# Patient Record
Sex: Male | Born: 1990 | Race: White | Hispanic: No | Smoking: Former smoker
Health system: Southern US, Community
[De-identification: ages and names within clinical notes are randomized; demographics above are authoritative.]

## PROBLEM LIST (undated history)

## (undated) DIAGNOSIS — K760 Fatty (change of) liver, not elsewhere classified: Secondary | ICD-10-CM

## (undated) DIAGNOSIS — R0789 Other chest pain: Secondary | ICD-10-CM

## (undated) DIAGNOSIS — K219 Gastro-esophageal reflux disease without esophagitis: Secondary | ICD-10-CM

## (undated) DIAGNOSIS — E782 Mixed hyperlipidemia: Secondary | ICD-10-CM

## (undated) DIAGNOSIS — E559 Vitamin D deficiency, unspecified: Secondary | ICD-10-CM

## (undated) DIAGNOSIS — R7401 Elevation of levels of liver transaminase levels: Secondary | ICD-10-CM

## (undated) HISTORY — DX: Elevation of levels of liver transaminase levels: R74.01

## (undated) HISTORY — DX: Vitamin D deficiency, unspecified: E55.9

## (undated) HISTORY — PX: WISDOM TOOTH EXTRACTION: SHX21

## (undated) HISTORY — PX: SHOULDER SURGERY: SHX246

## (undated) HISTORY — PX: EYE SURGERY: SHX253

## (undated) HISTORY — DX: Fatty (change of) liver, not elsewhere classified: K76.0

## (undated) HISTORY — DX: Other chest pain: R07.89

## (undated) HISTORY — DX: Mixed hyperlipidemia: E78.2

## (undated) HISTORY — DX: Gastro-esophageal reflux disease without esophagitis: K21.9

---
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
## 2012-12-22 ENCOUNTER — Ambulatory Visit (INDEPENDENT_AMBULATORY_CARE_PROVIDER_SITE_OTHER): Payer: BC Managed Care – PPO | Admitting: Sports Medicine

## 2012-12-22 ENCOUNTER — Encounter: Payer: Self-pay | Admitting: Sports Medicine

## 2012-12-22 VITALS — BP 107/58 | HR 43 | Wt 189.0 lb

## 2012-12-22 DIAGNOSIS — IMO0002 Reserved for concepts with insufficient information to code with codable children: Secondary | ICD-10-CM

## 2012-12-22 DIAGNOSIS — M5412 Radiculopathy, cervical region: Secondary | ICD-10-CM | POA: Insufficient documentation

## 2012-12-22 DIAGNOSIS — M5416 Radiculopathy, lumbar region: Secondary | ICD-10-CM | POA: Insufficient documentation

## 2012-12-22 MED ORDER — CYCLOBENZAPRINE HCL 10 MG PO TABS: ORAL_TABLET | ORAL | Status: DC

## 2012-12-22 MED ORDER — KETOROLAC TROMETHAMINE 30 MG/ML IJ SOLN
30.0000 mg | Freq: Once | INTRAMUSCULAR | Status: AC
  Administered 2012-12-22: 30 mg via INTRAMUSCULAR

## 2012-12-22 MED ORDER — PREDNISONE 50 MG PO TABS: ORAL_TABLET | ORAL | Status: DC

## 2012-12-22 MED ORDER — MELOXICAM 15 MG PO TABS: ORAL_TABLET | ORAL | Status: DC
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
## 2012-12-22 NOTE — Assessment & Plan Note (Signed)
Likely left L5. Prednisone, Mobic, Flexeril, formal physical therapy. Return in one month, MRI for interventional injection planning if no better.

## 2012-12-22 NOTE — Assessment & Plan Note (Signed)
Likely left C7. Prednisone, Mobic, Flexeril, formal physical therapy. Return in one month, MRI for interventional injection planning if no better.

## 2012-12-28 ENCOUNTER — Ambulatory Visit (INDEPENDENT_AMBULATORY_CARE_PROVIDER_SITE_OTHER): Payer: BC Managed Care – PPO | Admitting: Physical Therapy

## 2012-12-28 DIAGNOSIS — M2550 Pain in unspecified joint: Secondary | ICD-10-CM

## 2012-12-28 DIAGNOSIS — IMO0002 Reserved for concepts with insufficient information to code with codable children: Secondary | ICD-10-CM

## 2013-01-02 ENCOUNTER — Encounter (INDEPENDENT_AMBULATORY_CARE_PROVIDER_SITE_OTHER): Payer: BC Managed Care – PPO | Admitting: Physical Therapy

## 2013-01-02 DIAGNOSIS — IMO0002 Reserved for concepts with insufficient information to code with codable children: Secondary | ICD-10-CM

## 2013-01-02 DIAGNOSIS — M2550 Pain in unspecified joint: Secondary | ICD-10-CM

## 2013-01-04 ENCOUNTER — Encounter (INDEPENDENT_AMBULATORY_CARE_PROVIDER_SITE_OTHER): Payer: BC Managed Care – PPO | Admitting: Physical Therapy

## 2013-01-04 DIAGNOSIS — IMO0002 Reserved for concepts with insufficient information to code with codable children: Secondary | ICD-10-CM

## 2013-01-04 DIAGNOSIS — M2550 Pain in unspecified joint: Secondary | ICD-10-CM

## 2013-01-08 ENCOUNTER — Encounter (INDEPENDENT_AMBULATORY_CARE_PROVIDER_SITE_OTHER): Payer: BC Managed Care – PPO | Admitting: Physical Therapy

## 2013-01-08 DIAGNOSIS — IMO0002 Reserved for concepts with insufficient information to code with codable children: Secondary | ICD-10-CM

## 2013-01-08 DIAGNOSIS — M2550 Pain in unspecified joint: Secondary | ICD-10-CM

## 2013-01-11 ENCOUNTER — Encounter: Payer: BC Managed Care – PPO | Admitting: Physical Therapy

## 2013-01-11 DIAGNOSIS — M2550 Pain in unspecified joint: Secondary | ICD-10-CM

## 2013-01-11 DIAGNOSIS — IMO0002 Reserved for concepts with insufficient information to code with codable children: Secondary | ICD-10-CM

## 2013-01-18 ENCOUNTER — Encounter (INDEPENDENT_AMBULATORY_CARE_PROVIDER_SITE_OTHER): Payer: BC Managed Care – PPO | Admitting: Physical Therapy

## 2013-01-18 DIAGNOSIS — M2550 Pain in unspecified joint: Secondary | ICD-10-CM

## 2013-01-18 DIAGNOSIS — IMO0002 Reserved for concepts with insufficient information to code with codable children: Secondary | ICD-10-CM

## 2013-01-22 ENCOUNTER — Encounter (INDEPENDENT_AMBULATORY_CARE_PROVIDER_SITE_OTHER): Payer: BC Managed Care – PPO | Admitting: Physical Therapy

## 2013-01-22 DIAGNOSIS — IMO0002 Reserved for concepts with insufficient information to code with codable children: Secondary | ICD-10-CM

## 2013-01-22 DIAGNOSIS — M2550 Pain in unspecified joint: Secondary | ICD-10-CM

## 2013-01-23 ENCOUNTER — Ambulatory Visit (INDEPENDENT_AMBULATORY_CARE_PROVIDER_SITE_OTHER): Payer: BC Managed Care – PPO | Admitting: Sports Medicine

## 2013-01-23 ENCOUNTER — Encounter: Payer: Self-pay | Admitting: Sports Medicine

## 2013-01-23 ENCOUNTER — Ambulatory Visit (INDEPENDENT_AMBULATORY_CARE_PROVIDER_SITE_OTHER): Payer: BC Managed Care – PPO

## 2013-01-23 VITALS — BP 132/80 | HR 70 | Wt 198.0 lb

## 2013-01-23 DIAGNOSIS — IMO0002 Reserved for concepts with insufficient information to code with codable children: Secondary | ICD-10-CM

## 2013-01-23 DIAGNOSIS — M5412 Radiculopathy, cervical region: Secondary | ICD-10-CM

## 2013-01-23 DIAGNOSIS — M5416 Radiculopathy, lumbar region: Secondary | ICD-10-CM

## 2013-01-23 DIAGNOSIS — M5126 Other intervertebral disc displacement, lumbar region: Secondary | ICD-10-CM

## 2013-01-23 MED ORDER — TRAMADOL HCL 50 MG PO TABS: 50.0000 mg | ORAL_TABLET | Freq: Three times a day (TID) | ORAL | Status: DC | PRN

## 2013-01-23 NOTE — Progress Notes (Signed)
  Subjective:    CC: Followup  HPI: Cervical radiculitis: Resolved with physical therapy and conservative measures.  Left lumbar radiculitis: Continues to have pain that radiates down the posterior aspect of the left leg and thigh. It's worse when sitting in a car for long periods of time, worse with Valsalva. He is improved significantly with physical therapy, as well as oral steroids, and NSAIDs, but unfortunately continues to have pain that is moderate, persistent. No bowel or bladder dysfunction, no constitutional symptoms.  Past medical history, Surgical history, Family history not pertinant except as noted below, Social history, Allergies, and medications have been entered into the medical record, reviewed, and no changes needed.   Review of Systems: No fevers, chills, night sweats, weight loss, chest pain, or shortness of breath.   Objective:    General: Well Developed, well nourished, and in no acute distress.  Neuro: Alert and oriented x3, extra-ocular muscles intact, sensation grossly intact.  HEENT: Normocephalic, atraumatic, pupils equal round reactive to light, neck supple, no masses, no lymphadenopathy, thyroid nonpalpable.  Skin: Warm and dry, no rashes. Cardiac: Regular rate and rhythm, no murmurs rubs or gallops, no lower extremity edema.  Respiratory: Clear to auscultation bilaterally. Not using accessory muscles, speaking in full sentences. Back Exam:  Inspection: Unremarkable  Motion: Flexion 45 deg, Extension 45 deg, Side Bending to 45 deg bilaterally,  Rotation to 45 deg bilaterally  SLR laying: Negative  XSLR laying: Negative  Palpable tenderness: None. FABER: negative. Sensory change: Gross sensation intact to all lumbar and sacral dermatomes.  Reflexes: 2+ at both patellar tendons, 2+ at achilles tendons, Babinski's downgoing.  Strength at foot  Plantar-flexion: 5/5 Dorsi-flexion: 5/5 Eversion: 5/5 Inversion: 5/5  Leg strength  Quad: 5/5 Hamstring: 5/5 Hip  flexor: 5/5 Hip abductors: 5/5  Gait unremarkable.  Impression and Recommendations:

## 2013-01-23 NOTE — Assessment & Plan Note (Signed)
Continues to have L5 versus S1 radiculitis on the left side. MRI for interventional injection planning. Tramadol, return to go over MRI results.

## 2013-01-23 NOTE — Assessment & Plan Note (Signed)
Resolved with physical therapy and oral steroids.

## 2013-02-05 ENCOUNTER — Ambulatory Visit (INDEPENDENT_AMBULATORY_CARE_PROVIDER_SITE_OTHER): Payer: BC Managed Care – PPO | Admitting: Sports Medicine

## 2013-02-05 ENCOUNTER — Encounter: Payer: Self-pay | Admitting: Sports Medicine

## 2013-02-05 VITALS — BP 106/58 | HR 49 | Wt 192.0 lb

## 2013-02-05 DIAGNOSIS — M5416 Radiculopathy, lumbar region: Secondary | ICD-10-CM

## 2013-02-05 DIAGNOSIS — IMO0002 Reserved for concepts with insufficient information to code with codable children: Secondary | ICD-10-CM
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
## 2013-02-05 NOTE — Assessment & Plan Note (Signed)
MRI confirms left-sided L5-S1 disc extrusion. This corresponds with symptoms, Scott Herring has failed conservative measures. At this point we are going to proceed with interventional injection at the L5-S1 level on the left side. I like to see him back after the injection.

## 2013-02-06 ENCOUNTER — Telehealth: Payer: Self-pay

## 2013-02-06 NOTE — Telephone Encounter (Signed)
Patient called to follow up with appt for injections. I called him back and left a message on his home vm advising him that Danyelle from [facility] would be contacting him to schedule that appt. Rhonda Cunningham,CMA

## 2013-02-07 ENCOUNTER — Telehealth: Payer: Self-pay

## 2013-02-07 NOTE — Telephone Encounter (Signed)
Patient called upset that he can not get an appt for epidural for this week, he wants to know if you can give him a Rx for something stronger, and he also stated that he will be out of town for 2 weeks.

## 2013-02-08 MED ORDER — HYDROCODONE-ACETAMINOPHEN 5-325 MG PO TABS: 1.0000 | ORAL_TABLET | Freq: Three times a day (TID) | ORAL | Status: DC | PRN

## 2013-02-08 NOTE — Telephone Encounter (Signed)
Left message on patient vm that Rx Hydrocodone was at office ready for pick up. Rhonda Cunningham,CMA

## 2013-02-08 NOTE — Telephone Encounter (Signed)
Why can't they do an epidural this week??  Also yes, I'll leave an RX for something in my box.

## 2013-02-09 ENCOUNTER — Ambulatory Visit
Admission: RE | Admit: 2013-02-09 | Discharge: 2013-02-09 | Disposition: A | Discharge status: Home / Self Care | Payer: BC Managed Care – PPO | Source: Ambulatory Visit | Attending: Sports Medicine | Admitting: Sports Medicine

## 2013-02-09 ENCOUNTER — Telehealth: Payer: Self-pay | Admitting: Sports Medicine

## 2013-02-09 VITALS — BP 121/52 | HR 48

## 2013-02-09 DIAGNOSIS — M5412 Radiculopathy, cervical region: Secondary | ICD-10-CM

## 2013-02-09 DIAGNOSIS — M5416 Radiculopathy, lumbar region: Secondary | ICD-10-CM

## 2013-02-09 MED ORDER — METHYLPREDNISOLONE ACETATE 40 MG/ML INJ SUSP (RADIOLOG
120.0000 mg | Freq: Once | INTRAMUSCULAR | Status: AC
  Administered 2013-02-09: 120 mg via EPIDURAL

## 2013-02-09 MED ORDER — IOHEXOL 180 MG/ML  SOLN
1.0000 mL | Freq: Once | INTRAMUSCULAR | Status: AC | PRN
  Administered 2013-02-09: 1 mL via EPIDURAL

## 2013-02-09 NOTE — Telephone Encounter (Signed)
Patient stopped by yesterday; physical therapist would like to know if we would like them to continue services to him.

## 2013-02-09 NOTE — Telephone Encounter (Signed)
Please see note below. Jametta Moorehead,CMA  

## 2013-02-09 NOTE — Telephone Encounter (Signed)
I would absolutely like him to continue if he felt like he was getting any benefit at all. How is he feeling after the epidural?

## 2013-02-12 NOTE — Telephone Encounter (Signed)
Left detailed message on patient vm to continue Physical therapy if he is benefiting from it and to see how he was doing after the epidural injection. Ezra Denne,CMA

## 2013-04-09 ENCOUNTER — Ambulatory Visit (INDEPENDENT_AMBULATORY_CARE_PROVIDER_SITE_OTHER): Payer: BC Managed Care – PPO | Admitting: Sports Medicine

## 2013-04-09 ENCOUNTER — Encounter: Payer: Self-pay | Admitting: Sports Medicine

## 2013-04-09 VITALS — BP 122/72 | HR 48 | Wt 194.0 lb

## 2013-04-09 DIAGNOSIS — R21 Rash and other nonspecific skin eruption: Secondary | ICD-10-CM | POA: Insufficient documentation

## 2013-04-09 DIAGNOSIS — IMO0002 Reserved for concepts with insufficient information to code with codable children: Secondary | ICD-10-CM

## 2013-04-09 DIAGNOSIS — M5416 Radiculopathy, lumbar region: Secondary | ICD-10-CM

## 2013-04-09 MED ORDER — GABAPENTIN 300 MG PO CAPS: ORAL_CAPSULE | ORAL | Status: DC

## 2013-04-09 MED ORDER — TRIAMCINOLONE ACETONIDE 0.5 % EX OINT: 1.0000 "application " | TOPICAL_OINTMENT | Freq: Two times a day (BID) | CUTANEOUS | Status: DC

## 2013-04-09 NOTE — Assessment & Plan Note (Signed)
Excellent response to L5-S1 interlaminar epidural steroid injection. I am going to add some gabapentin, and he will let us know when he is ready for another one if needed.

## 2013-04-09 NOTE — Progress Notes (Signed)
  Subjective:    CC: Follow up  HPI: Lumbar degenerative disc disease: With a left lumbar radiculitis, most recently had a lumbar epidural at the L5-S1 level which was tremendously effective. He felt fantastic for several days, only recently as he started to have some pain. He is not yet ready to have another epidural.  Skin rash: Left elbow, painful, burns a little bit, mild.  Past medical history, Surgical history, Family history not pertinant except as noted below, Social history, Allergies, and medications have been entered into the medical record, reviewed, and no changes needed.   Review of Systems: No fevers, chills, night sweats, weight loss, chest pain, or shortness of breath.   Objective:    General: Well Developed, well nourished, and in no acute distress.  Neuro: Alert and oriented x3, extra-ocular muscles intact, sensation grossly intact.  HEENT: Normocephalic, atraumatic, pupils equal round reactive to light, neck supple, no masses, no lymphadenopathy, thyroid nonpalpable.  Skin: Warm and dry, there is a rash on his left elbow, approximately 4 cm distal to the olecranon, is mildly excoriated and erythematous with some scaling. Cardiac: Regular rate and rhythm, no murmurs rubs or gallops, no lower extremity edema.  Respiratory: Clear to auscultation bilaterally. Not using accessory muscles, speaking in full sentences.  Impression and Recommendations:

## 2013-04-09 NOTE — Assessment & Plan Note (Signed)
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 I will call him in some triamcinolone cream, if no better in a couple of weeks we can certainly biopsy this, and proceed down the route of treatment for psoriasis.

## 2013-04-23 ENCOUNTER — Ambulatory Visit: Payer: BC Managed Care – PPO | Admitting: Sports Medicine

## 2013-05-31 HISTORY — PX: SHOULDER SURGERY: SHX246

## 2013-05-31 HISTORY — PX: ELBOW SURGERY: SHX618

## 2013-11-27 ENCOUNTER — Ambulatory Visit (INDEPENDENT_AMBULATORY_CARE_PROVIDER_SITE_OTHER): Payer: BC Managed Care – PPO | Admitting: Sports Medicine

## 2013-11-27 ENCOUNTER — Encounter: Payer: Self-pay | Admitting: Sports Medicine

## 2013-11-27 VITALS — BP 113/71 | HR 52 | Ht 71.0 in | Wt 183.0 lb

## 2013-11-27 DIAGNOSIS — H269 Unspecified cataract: Secondary | ICD-10-CM

## 2013-11-27 DIAGNOSIS — Z0000 Encounter for general adult medical examination without abnormal findings: Secondary | ICD-10-CM | POA: Insufficient documentation

## 2013-11-27 DIAGNOSIS — Z299 Encounter for prophylactic measures, unspecified: Secondary | ICD-10-CM

## 2013-11-27 DIAGNOSIS — M5416 Radiculopathy, lumbar region: Secondary | ICD-10-CM

## 2013-11-27 DIAGNOSIS — IMO0002 Reserved for concepts with insufficient information to code with codable children: Secondary | ICD-10-CM

## 2013-11-27 NOTE — Progress Notes (Signed)
  Subjective:    CC: Complete physical exam  HPI:  Scott Herring is a very pleasant 23 year old male, he needs a physical for work.  Left cataract: Needs a local optometrist and ophthalmologist. Sounds as though he has had a lens implant.  Left lumbar radiculitis: Continues to be resolved after left-sided selective S1 nerve root block, it was an issue with the insurance, and he needs it coded with a different ICD-9 code.  Past medical history, Surgical history, Family history not pertinant except as noted below, Social history, Allergies, and medications have been entered into the medical record, reviewed, and no changes needed.   Review of Systems: No headache, visual changes, nausea, vomiting, diarrhea, constipation, dizziness, abdominal pain, skin rash, fevers, chills, night sweats, swollen lymph nodes, weight loss, chest pain, body aches, joint swelling, muscle aches, shortness of breath, mood changes, visual or auditory hallucinations.  Objective:    General: Well Developed, well nourished, and in no acute distress.  Neuro: Alert and oriented x3, extra-ocular muscles intact, sensation grossly intact.  HEENT: Normocephalic, atraumatic, right pupil is equal round and reactive to light and accommodation, left pupil is fairly fixed, and has an abnormal configuration, neck supple, no masses, no lymphadenopathy, thyroid nonpalpable.  oropharynx, nasopharynx, actually her canals are unremarkable. Skin: Warm and dry, no rashes noted.  Cardiac: Regular rate and rhythm, no murmurs rubs or gallops.  Respiratory: Clear to auscultation bilaterally. Not using accessory muscles, speaking in full sentences.  Abdominal: Soft, nontender, nondistended, positive bowel sounds, no masses, no organomegaly.  Musculoskeletal: Shoulder, elbow, wrist, hip, knee, ankle stable, and with full range of motion.  Impression and Recommendations:    The patient was counselled, risk factors were discussed, anticipatory  guidance given.

## 2013-11-27 NOTE — Assessment & Plan Note (Signed)
Referral to optometry

## 2013-11-27 NOTE — Assessment & Plan Note (Signed)
Complete physical performed today. He will forward us his blood work.

## 2013-11-27 NOTE — Assessment & Plan Note (Signed)
Continues to be resolved after selective S1 nerve root block. Unfortunately it seems that there was an error with the coding. I would like triage to call his insurance and find out what code would make this covered.

## 2015-03-03 ENCOUNTER — Encounter: Payer: Self-pay | Admitting: Sports Medicine

## 2015-03-03 ENCOUNTER — Ambulatory Visit (INDEPENDENT_AMBULATORY_CARE_PROVIDER_SITE_OTHER): Payer: BLUE CROSS/BLUE SHIELD | Admitting: Sports Medicine

## 2015-03-03 VITALS — BP 117/54 | HR 55 | Ht 71.0 in | Wt 191.0 lb

## 2015-03-03 DIAGNOSIS — L700 Acne vulgaris: Secondary | ICD-10-CM | POA: Diagnosis not present

## 2015-03-03 DIAGNOSIS — Z0000 Encounter for general adult medical examination without abnormal findings: Secondary | ICD-10-CM | POA: Diagnosis not present

## 2015-03-03 MED ORDER — DOXYCYCLINE HYCLATE 100 MG PO TABS: 100.0000 mg | ORAL_TABLET | Freq: Every day | ORAL | Status: DC

## 2015-03-03 NOTE — Progress Notes (Signed)
  Subjective:    CC: Complete physical   HPI:  Preventive measures: Up-to-date on tetanus vaccination, flu vaccine is scheduled, blood work is done through his employer.  Acne: Amenable to trying oral antibiotics.  Past medical history, Surgical history, Family history not pertinant except as noted below, Social history, Allergies, and medications have been entered into the medical record, reviewed, and no changes needed.   Review of Systems: No headache, visual changes, nausea, vomiting, diarrhea, constipation, dizziness, abdominal pain, skin rash, fevers, chills, night sweats, swollen lymph nodes, weight loss, chest pain, body aches, joint swelling, muscle aches, shortness of breath, mood changes, visual or auditory hallucinations.  Objective:   General: Well Developed, well nourished, and in no acute distress.  Neuro: Alert and oriented x3, extra-ocular muscles intact, sensation grossly intact. Cranial nerves II through XII are intact, motor, sensory, and coordinative functions are all intact. HEENT: Normocephalic, atraumatic, right pupil is round and reactive to light, left pupil is post lens implant, neck supple, no masses, no lymphadenopathy, thyroid nonpalpable. Oropharynx, nasopharynx, external ear canals are unremarkable. Skin: Warm and dry, no rashes noted. Multiple comedones on the back, inflammatory. Cardiac: Regular rate and rhythm, no murmurs rubs or gallops.  Respiratory: Clear to auscultation bilaterally. Not using accessory muscles, speaking in full sentences.  Abdominal: Soft, nontender, nondistended, positive bowel sounds, no masses, no organomegaly.  Musculoskeletal: Shoulder, elbow, wrist, hip, knee, ankle stable, and with full range of motion.  Impression and Recommendations:    The patient was counselled, risk factors were discussed, anticipatory guidance given.

## 2015-03-03 NOTE — Assessment & Plan Note (Signed)
Completed his glasses above, blood work is done through his employer, results will be forwarded to [place].

## 2015-03-03 NOTE — Assessment & Plan Note (Signed)
Adding doxycycline 100 mg daily, we will anticipate 1-3 months of treatment.

## 2015-03-31 ENCOUNTER — Ambulatory Visit (INDEPENDENT_AMBULATORY_CARE_PROVIDER_SITE_OTHER): Payer: BLUE CROSS/BLUE SHIELD | Admitting: Sports Medicine

## 2015-03-31 ENCOUNTER — Encounter: Payer: Self-pay | Admitting: Sports Medicine

## 2015-03-31 VITALS — BP 130/76 | HR 67 | Ht 71.0 in | Wt 196.0 lb

## 2015-03-31 DIAGNOSIS — L700 Acne vulgaris: Secondary | ICD-10-CM | POA: Diagnosis not present

## 2015-03-31 MED ORDER — DOXYCYCLINE HYCLATE 100 MG PO TABS: 100.0000 mg | ORAL_TABLET | Freq: Every day | ORAL | Status: DC

## 2015-03-31 NOTE — Assessment & Plan Note (Signed)
Continue doxycycline, fantastic improvement in extensive acne on his back after a single month. Return to see me in 6 months.

## 2015-03-31 NOTE — Progress Notes (Signed)
  Subjective:    CC: follow-up  HPI: Acne: Fantastic response to oral doxycycline. Acne was predominantly on the back and is nearly completely clear. This is after one month of oral doxycycline treatment.  Past medical history, Surgical history, Family history not pertinant except as noted below, Social history, Allergies, and medications have been entered into the medical record, reviewed, and no changes needed.   Review of Systems: No fevers, chills, night sweats, weight loss, chest pain, or shortness of breath.   Objective:    General: Well Developed, well nourished, and in no acute distress.  Neuro: Alert and oriented x3, extra-ocular muscles intact, sensation grossly intact.  HEENT: Normocephalic, atraumatic, pupils equal round reactive to light, neck supple, no masses, no lymphadenopathy, thyroid nonpalpable.  Skin: Warm and dry, no rashes.  there are only 2 pustules on his back. There is significant scarring from old acne. Cardiac: Regular rate and rhythm, no murmurs rubs or gallops, no lower extremity edema.  Respiratory: Clear to auscultation bilaterally. Not using accessory muscles, speaking in full sentences.  Impression and Recommendations:

## 2015-06-01 HISTORY — PX: TONSILLECTOMY: SUR1361

## 2015-09-01 ENCOUNTER — Ambulatory Visit (INDEPENDENT_AMBULATORY_CARE_PROVIDER_SITE_OTHER): Payer: BLUE CROSS/BLUE SHIELD | Admitting: Family Medicine

## 2015-09-01 ENCOUNTER — Encounter: Payer: Self-pay | Admitting: Family Medicine

## 2015-09-01 ENCOUNTER — Ambulatory Visit (INDEPENDENT_AMBULATORY_CARE_PROVIDER_SITE_OTHER): Payer: BLUE CROSS/BLUE SHIELD

## 2015-09-01 VITALS — BP 134/78 | HR 69 | Wt 200.0 lb

## 2015-09-01 DIAGNOSIS — S43005A Unspecified dislocation of left shoulder joint, initial encounter: Secondary | ICD-10-CM | POA: Diagnosis not present

## 2015-09-01 DIAGNOSIS — S5400XA Injury of ulnar nerve at forearm level, unspecified arm, initial encounter: Secondary | ICD-10-CM | POA: Insufficient documentation

## 2015-09-01 DIAGNOSIS — S5402XA Injury of ulnar nerve at forearm level, left arm, initial encounter: Secondary | ICD-10-CM | POA: Diagnosis not present

## 2015-09-01 DIAGNOSIS — X58XXXA Exposure to other specified factors, initial encounter: Secondary | ICD-10-CM | POA: Diagnosis not present

## 2015-09-01 MED ORDER — GABAPENTIN 300 MG PO CAPS: ORAL_CAPSULE | ORAL | Status: DC
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
## 2015-09-01 NOTE — Patient Instructions (Addendum)
Thank you for coming in today. Return next week.  Sleep in the sling.  I am worried about an ulnar nerve injury.  Take percocet for pain as needed.   Use gabapentin as needed  Get CT scan today  Or tomorrow.     Ulnar Nerve Contusion With Rehab The ulnar nerve lies near the surface of the skin as it passes by the elbow. This location causes it to be susceptible to injury. An ulnar nerve contusion is a bruise of the nerve. It is the result of direct trauma to the elbow. Ulnar nerve contusions are characterized by pain, weakness, and loss of feeling in the hand. SYMPTOMS   Signs of nerve damage include: tingling, numbness, weakness, and/or loss of feeling in the hand, specifically the little finger and ring finger.  Sharp pains that may shoot from the elbow to the wrist and hand.  Decreased hand function.  Tenderness and/ or inflammation in the elbow.  Muscle wasting (atrophy) in the hand. CAUSES  Ulnar nerve contusions are caused by direct trauma to the elbow that results in bleeding which enters the nerve. RISK INCREASES WITH:  Contact sports (football, soccer, or rugby).  Bleeding disorders.  Taking blood thinning medicine (warfarin [Coumadin], aspirin, or nonsteroidal anti-inflammatory medications).  Diabetes mellitus.  Underactive thyroid gland (hypothyroidism). PREVENTION  Wear properly fitted and padded protective equipment.  Only take blood thinning medication when necessary. PROGNOSIS  Ulnar nerve contusions usually heal within 6 weeks. Healing often occurs spontaneously, but treatment helps reduce symptoms.  RELATED COMPLICATIONS   Permanent nerve damage, including pain, numbness, tingling, or weakness in the hand (rare).  Weak grip.  Prolonged healing time, if improperly treated or re-injured. TREATMENT  Treatment initially involves resting from any activities that aggravate the symptoms, and the use of ice and medications to help reduce pain and  inflammation. The use of strengthening and stretching exercises may help reduce pain with activity. These exercises may be performed at home or with referral to a therapist. Your caregiver may recommend that you splint the elbow at night to help healing of the nerve. If symptoms persist despite conservative (non-surgical) treatment, then surgery may be recommended to free the nerve. MEDICATION   If pain medication is necessary, then nonsteroidal anti-inflammatory medications, such as aspirin and ibuprofen, or other minor pain relievers, such as acetaminophen, are often recommended.  Do not take pain medication within 7 days before surgery.  Prescription pain relievers may be given if deemed necessary by your caregiver. Use only as directed and only as much as you need. COLD THERAPY  Cold treatment (icing) relieves pain and reduces inflammation. Cold treatment should be applied for 10 to 15 minutes every 2 to 3 hours for inflammation and pain and immediately after any activity that aggravates your symptoms. Use ice packs or massage the area with a piece of ice (ice massage). SEEK MEDICAL CARE IF:   Treatment seems to offer no benefit, or the condition worsens.  Any medications produce adverse side effects. EXERCISES RANGE OF MOTION (ROM) AND STRETCHING EXERCISES - Ulnar Nerve Contusion These exercises may help you when beginning to rehabilitate your injury. Do not begin these exercises until your physician, physical therapist or athletic trainer advises you to do so. Discontinue any exercise that worsens your symptoms. Contact your physician with instructions on how to continue. Your symptoms may resolve with or without further involvement from your physician, physical therapist or athletic trainer. While completing these exercises, remember:  Restoring tissue flexibility helps  normal motion to return to the joints. This allows healthier, less painful movement and activity.  An effective stretch  should be held for at least 30 seconds.  A stretch should never be painful. You should only feel a gentle lengthening or release in the stretched tissue. RANGE OF MOTION - Extension  Hold your right / left arm at your side and straighten your elbow as far as you can using your right / left arm muscles.  Straighten the right / left elbow farther by gently pushing down on your forearm until you feel a gentle stretch on the inside of your elbow. Hold this position for __________ seconds.  Slowly return to the starting position. Repeat __________ times. Complete this exercise __________ times per day.  RANGE OF MOTION - Flexion  Hold your right / left arm at your side and bend your elbow as far as you can using your right / left arm muscles.  Bend the right / left elbow farther by gently pushing up on your forearm until you feel a gentle stretch on the outside of your elbow. Hold this position for __________ seconds.  Slowly return to the starting position. Repeat __________ times. Complete this exercise __________ times per day.  RANGE OF MOTION - Wrist Flexion, Active-Assisted  Extend your right / left elbow with your fingers pointing down.*  Gently pull the back of your hand towards you until you feel a gentle stretch on the top of your forearm.  Hold this position for __________ seconds. Repeat __________ times. Complete this exercise __________ times per day.  *If directed by your physician, physical therapist or athletic trainer, complete this stretch with your elbow bent rather than extended. RANGE OF MOTION - Wrist Extension, Active-Assisted  Extend your right / left elbow and turn your palm upwards.*  Gently pull your palm/fingertips back so your wrist extends and your fingers point more toward the ground.  You should feel a gentle stretch on the inside of your forearm.  Hold this position for __________ seconds. Repeat __________ times. Complete this exercise __________  times per day. *If directed by your physician, physical therapist or athletic trainer, complete this stretch with your elbow bent, rather than extended. RANGE OF MOTION - Supination, Active  Stand or sit with your elbows at your side. Bend your right / left elbow to 90 degrees.  Turn your palm upward until you feel a gentle stretch on the inside of your forearm.  Hold this position for __________ seconds. Slowly release and return to the starting position. Repeat __________ times. Complete this stretch __________ times per day.  RANGE OF MOTION - Pronation, Active  Stand or sit with your elbows at your side. Bend your right / left elbow to 90 degrees.  Turn your palm downward until you feel a gentle stretch on the top of your forearm.  Hold this position for __________ seconds. Slowly release and return to the starting position. Repeat __________ times. Complete this stretch __________ times per day.  STRETCH - Wrist Flexion   Place the back of your right / left hand on a tabletop leaving your elbow slightly bent. Your fingers should point away from your body.  Gently press the back of your hand down onto the table by straightening your elbow. You should feel a stretch on the top of your forearm.  Hold this position for __________ seconds. Repeat __________ times. Complete this stretch __________ times per day.  STRETCH - Wrist Extension   Place your right /  left fingertips on a tabletop leaving your elbow slightly bent. Your fingers should point backwards.  Gently press your fingers and palm down onto the table by straightening your elbow. You should feel a stretch on the inside of your forearm.  Hold this position for __________ seconds. Repeat __________ times. Complete this stretch __________ times per day.  STRENGTHENING EXERCISES - Ulnar Nerve Contusion These exercises may help you when beginning to rehabilitate your injury. Do not begin these exercises until your physician,  physical therapist or athletic trainer advises you to do so. Discontinue any exercise that worsens your symptoms. Contact your physician for instructions on how to continue. They may resolve your symptoms with or without further involvement from your physician, physical therapist or athletic trainer. While completing these exercises, remember:   Muscles can gain both the endurance and the strength needed for everyday activities through controlled exercises.  Complete these exercises as instructed by your physician, physical therapist or athletic trainer. Progress with the resistance and repetition exercises only as your caregiver advises. STRENGTH - Wrist Flexors  Sit with your right / left forearm palm-up and fully supported. Your elbow should be resting below the height of your shoulder. Allow your wrist to extend over the edge of the surface.  Loosely holding a __________ weight or a piece of rubber exercise band/tubing, slowly curl your hand up toward your forearm.  Hold this position for __________ seconds. Slowly lower the wrist back to the starting position in a controlled manner. Repeat __________ times. Complete this exercise __________ times per day.  STRENGTH - Wrist Extensors  Sit with your right / left forearm palm-down and fully supported. Your elbow should be resting below the height of your shoulder. Allow your wrist to extend over the edge of the surface.  Loosely holding a __________ weight or a piece of rubber exercise band/tubing, slowly curl your hand up toward your forearm.  Hold this position for __________ seconds. Slowly lower the wrist back to the starting position in a controlled manner. Repeat __________ times. Complete this exercise __________ times per day.  STRENGTH - Ulnar Deviators  Stand with a ____________________ weight in your right / left hand or sit holding on to the rubber exercise band/tubing with your opposite arm supported.  Move your wrist so that  your pinkie travels toward your forearm and your thumb moves away from your forearm.  Hold this position for __________ seconds and then slowly lower the wrist back to the starting position. Repeat __________ times. Complete this exercise __________ times per day STRENGTH - Radial Deviators  Stand with a ____________________ weight in your right / left hand or sit holding on to the rubber exercise band/tubing with your arm supported.  Raise your hand upward in front of you or pull up on the rubber tubing.  Hold this position for __________ seconds and then slowly lower the wrist back to the starting position. Repeat __________ times. Complete this exercise __________ times per day. STRENGTH - Grip  Grasp a tennis ball, a dense sponge, or a large, rolled sock in your hand.  Squeeze as hard as you can without increasing any pain.  Hold this position for __________ seconds. Release your grip slowly. Repeat __________ times. Complete this exercise __________ times per day.    This information is not intended to replace advice given to you by your health care provider. Make sure you discuss any questions you have with your health care provider.   Document Released: 05/17/2005 Document Revised:  10/01/2014 Document Reviewed: 08/29/2008 Elsevier Interactive Patient Education [employer]

## 2015-09-02 ENCOUNTER — Telehealth: Payer: Self-pay | Admitting: Family Medicine

## 2015-09-02 DIAGNOSIS — S43005A Unspecified dislocation of left shoulder joint, initial encounter: Secondary | ICD-10-CM

## 2015-09-02 DIAGNOSIS — G5622 Lesion of ulnar nerve, left upper limb: Secondary | ICD-10-CM

## 2015-09-02 NOTE — Telephone Encounter (Signed)
I discussed the results of the CT scan with the patient. Will order MRI arthrogram and make sure pt with scheduled with Dr. Karie Schwalbe or myself on Monday for consultation and injection of contrast.

## 2015-09-02 NOTE — Progress Notes (Signed)
Quick Note:  CT shows a mild fracture. It should heal well. ______

## 2015-09-03 NOTE — Telephone Encounter (Signed)
Imaging has been authorized and scheduled.

## 2015-09-08 ENCOUNTER — Ambulatory Visit (INDEPENDENT_AMBULATORY_CARE_PROVIDER_SITE_OTHER): Payer: BLUE CROSS/BLUE SHIELD | Admitting: Family Medicine

## 2015-09-08 ENCOUNTER — Encounter: Payer: Self-pay | Admitting: Family Medicine

## 2015-09-08 ENCOUNTER — Ambulatory Visit: Payer: BLUE CROSS/BLUE SHIELD | Admitting: Sports Medicine

## 2015-09-08 ENCOUNTER — Ambulatory Visit (INDEPENDENT_AMBULATORY_CARE_PROVIDER_SITE_OTHER): Payer: BLUE CROSS/BLUE SHIELD

## 2015-09-08 VITALS — BP 112/50 | HR 64 | Wt 199.0 lb

## 2015-09-08 DIAGNOSIS — X58XXXA Exposure to other specified factors, initial encounter: Secondary | ICD-10-CM | POA: Diagnosis not present

## 2015-09-08 DIAGNOSIS — S43005A Unspecified dislocation of left shoulder joint, initial encounter: Secondary | ICD-10-CM

## 2015-09-08 DIAGNOSIS — S5402XA Injury of ulnar nerve at forearm level, left arm, initial encounter: Secondary | ICD-10-CM | POA: Diagnosis not present

## 2015-09-08 MED ORDER — AMITRIPTYLINE HCL 25 MG PO TABS: 25.0000 mg | ORAL_TABLET | Freq: Every day | ORAL | Status: DC

## 2015-09-08 MED ORDER — OXYCODONE-ACETAMINOPHEN 5-325 MG PO TABS: 1.0000 | ORAL_TABLET | Freq: Three times a day (TID) | ORAL | Status: DC | PRN

## 2015-09-08 NOTE — Assessment & Plan Note (Signed)
Continued ulnar nerve injury symptoms. MRI pending. Continue gabapentin. Add amitriptyline.

## 2015-09-08 NOTE — Progress Notes (Signed)
Scott Herring is a 25 y.o. male who presents to [facility] today for follow-up left shoulder dislocation and follow-up paresthesias.  Patient was seen last week for a shoulder dislocation follow-up. He's here today for follow-up certification and also for interarticular contrast. 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 He notes continued numbness and tingling especially to the ulnar side of his hand and the fifth and on her half of the fourth digit. He notes continued weakness especially with hand adduction. He notes that gabapentin is mildly helpful. He's run out of the Percocet that were prescribed during his initial hospital visit.   No past medical history on file. No past surgical history on file. Social History  Substance Use Topics  . Smoking status: Smoker, Current Status Unknown  . Smokeless tobacco: Current User    Types: Chew  . Alcohol Use: Yes   family history is not on file.  ROS:  No headache, visual changes, nausea, vomiting, diarrhea, constipation, dizziness, abdominal pain, skin rash, fevers, chills, night sweats, weight loss, swollen lymph nodes, body aches, joint swelling, muscle aches, chest pain, shortness of breath, mood changes, visual or auditory hallucinations.    Medications: Current Outpatient Prescriptions  Medication Sig Dispense Refill  . gabapentin (NEURONTIN) 300 MG capsule One tab PO qHS for a week, then BID for a week, then TID. May double weekly to a max of 3,600mg /day 180 capsule 3  . amitriptyline (ELAVIL) 25 MG tablet Take 1 tablet (25 mg total) by mouth at bedtime. 90 tablet 0  . oxyCODONE-acetaminophen (PERCOCET/ROXICET) 5-325 MG tablet Take 1 tablet by mouth every 8 (eight) hours as needed. 30 tablet 0   No current facility-administered medications for this visit.   No Known Allergies   Exam:  BP 112/50 mmHg  Pulse 64  Wt 199 lb (90.266 kg) General: Well  Developed, well nourished, and in no acute distress.  Neuro/Psych: Alert and oriented x3, extra-ocular muscles intact, able to move all 4 extremities, sensation grossly intact. Skin: Warm and dry, no rashes noted.  Respiratory: Not using accessory muscles, speaking in full sentences, trachea midline.  Cardiovascular: Pulses palpable, no extremity edema. Abdomen: Does not appear distended. MSK: Left shoulder normal-appearing. Tender. Motion not tested. No deformity. Hand continued subjective numbness and tingling to the ulnar hand. He continues to have a mild hand of Benediction  and has weakness with hand adduction          Patient presents to clinic for  previously scheduled gadolinium interarticular contrast.  Procedure: Real-time Ultrasound Guided Injection of left shoulder  Device: GE Logiq E  Images permanently stored and available for review in the ultrasound unit. Verbal informed consent obtained. Discussed risks and benefits of procedure. Warned about infection bleeding damage to structures skin hypopigmentation and fat atrophy among others. Patient expresses understanding and agreement Time-out conducted.  Noted no overlying erythema, induration, or other signs of local infection.  Skin prepped in a sterile fashion.  Local anesthesia: Topical Ethyl chloride.  With sterile technique and under real time ultrasound guidance: 5 mL of lidocaine, gadolinium contrast and 9 mL of sterile saline injected easily.  Completed without difficulty    Advised to call if fevers/chills, erythema, induration, drainage, or persistent bleeding.  Images permanently stored and available for review in the ultrasound unit.  Impression: Technically successful ultrasound guided injection.  No results found for this or any previous visit (from the past 24 hour(s)). No results found.  Please see individual assessment and plan sections.

## 2015-09-08 NOTE — Assessment & Plan Note (Signed)
With Hill-Sachs injury and probable labrum tear. MRI arthrogram pending. Will call patient with results.

## 2015-09-08 NOTE — Patient Instructions (Signed)
Thank you for coming in today. Get MRI now.  We will call with results.

## 2015-09-10 ENCOUNTER — Telehealth: Payer: Self-pay | Admitting: Family Medicine

## 2015-09-10 NOTE — Telephone Encounter (Signed)
Quick Note:  MRI shows tearing of the labrum like we were worried about. I have [facility] you [facility] named [place] Ave Filterhandler. He is very good at this injury. ______

## 2015-09-10 NOTE — Telephone Encounter (Signed)
Referral to Dr Ave Filterhandler placed

## 2015-09-10 NOTE — Telephone Encounter (Signed)
I called the patient and discussed results and plan in detail.  Plan to f/u with Dr. Ave Filterhandler

## 2015-09-10 NOTE — Telephone Encounter (Signed)
-----   Message from Baird KayEuleta M Douglas, LPN sent at 6/21/30864/04/2016 11:02 AM EDT ----- Pt notified of results. Would like to know if the MRI results show anything about pinched nerves that would answer for his loss of sensation. Please advise.

## 2015-09-10 NOTE — Addendum Note (Signed)
Addended by: Rodolph BongOREY, Dema Timmons S on: 09/10/2015 07:35 AM   Modules accepted: Orders

## 2015-09-15 ENCOUNTER — Ambulatory Visit (INDEPENDENT_AMBULATORY_CARE_PROVIDER_SITE_OTHER): Payer: BLUE CROSS/BLUE SHIELD | Admitting: Sports Medicine

## 2015-09-15 VITALS — BP 136/84 | HR 73 | Resp 18 | Wt 201.1 lb

## 2015-09-15 DIAGNOSIS — S43005D Unspecified dislocation of left shoulder joint, subsequent encounter: Secondary | ICD-10-CM

## 2015-09-15 DIAGNOSIS — S5402XD Injury of ulnar nerve at forearm level, left arm, subsequent encounter: Secondary | ICD-10-CM

## 2015-09-15 MED ORDER — VITAMIN B-6 50 MG PO TABS: 50.0000 mg | ORAL_TABLET | Freq: Every day | ORAL | Status: DC
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
## 2015-09-15 NOTE — Assessment & Plan Note (Signed)
Severe ulnar neuropathy with extreme weakness. It is a bit early but I am going to proceed with nerve conduction and EMG to further determine the location of the neuropraxia. We did discuss that this would likely get better over a period of time. Adding pyridoxine 50 mg daily.
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
## 2015-09-26 ENCOUNTER — Encounter: Payer: Self-pay | Admitting: Sports Medicine

## 2015-09-26 ENCOUNTER — Ambulatory Visit (INDEPENDENT_AMBULATORY_CARE_PROVIDER_SITE_OTHER): Payer: BLUE CROSS/BLUE SHIELD | Admitting: Sports Medicine

## 2015-09-26 ENCOUNTER — Ambulatory Visit (INDEPENDENT_AMBULATORY_CARE_PROVIDER_SITE_OTHER): Payer: BLUE CROSS/BLUE SHIELD

## 2015-09-26 VITALS — BP 128/84 | HR 81 | Resp 18 | Wt 196.4 lb

## 2015-09-26 DIAGNOSIS — S5402XD Injury of ulnar nerve at forearm level, left arm, subsequent encounter: Secondary | ICD-10-CM

## 2015-09-26 DIAGNOSIS — L700 Acne vulgaris: Secondary | ICD-10-CM

## 2015-09-26 DIAGNOSIS — M542 Cervicalgia: Secondary | ICD-10-CM | POA: Diagnosis not present

## 2015-09-26 DIAGNOSIS — S43005D Unspecified dislocation of left shoulder joint, subsequent encounter: Secondary | ICD-10-CM

## 2015-09-26 MED ORDER — DOXYCYCLINE HYCLATE 100 MG PO TABS: 100.0000 mg | ORAL_TABLET | Freq: Every day | ORAL | Status: DC

## 2015-09-26 NOTE — Assessment & Plan Note (Signed)
Resolved with doxycycline, needs a refill.

## 2015-09-26 NOTE — Progress Notes (Signed)
  Subjective:    CC: follow-up  HPI: Left shoulder dislocation: Is now post arthroscopic labral repair by Dr. Ave Filterhandler.  Left ulnar neuropathy/neuropraxia: No improvement, not even slight in sensation or ulnar motor function. He has not yet had his nerve conduction and EMG.  Acne: Controlled with doxycycline, needs a refill.  Past medical history, Surgical history, Family history not pertinant except as noted below, Social history, Allergies, and medications have been entered into the medical record, reviewed, and no changes needed.   Review of Systems: No fevers, chills, night sweats, weight loss, chest pain, or shortness of breath.   Objective:    General: Well Developed, well nourished, and in no acute distress.  Neuro: Alert and oriented x3, extra-ocular muscles intact, sensation grossly intact.  HEENT: Normocephalic, atraumatic, pupils equal round reactive to light, neck supple, no masses, no lymphadenopathy, thyroid nonpalpable.  Skin: Warm and dry, no rashes. Cardiac: Regular rate and rhythm, no murmurs rubs or gallops, no lower extremity edema.  Respiratory: Clear to auscultation bilaterally. Not using accessory muscles, speaking in full sentences. Left hand: 1/5 strength to and finger abduction and adduction. Hypoesthesia in an ulnar nerve distribution, tenderness at the cubital tunnel.  Impression and Recommendations:   I spent 25 minutes with this patient, greater than 50% was face-to-face time counseling regarding the above diagnoses

## 2015-09-26 NOTE — Assessment & Plan Note (Signed)
Postop now from his arthroscopic labral repair, further management per Dr. Ave Filterhandler

## 2015-09-26 NOTE — Assessment & Plan Note (Addendum)
Persistence of ulnar neuropraxia, no evidence of improvement, I did explain that could take months for the nerve to recover, we are still awaiting nerve conduction and EMG considering profound nature of the ulnar neuropathy. Doubt cervical involvement, C8, we are going to obtain an x-ray to complete the workup.

## 2015-09-26 NOTE — Patient Instructions (Signed)
[facility] Address: [address], [place], [place] [address] Phone: [phone]

## 2015-10-01 ENCOUNTER — Telehealth: Payer: Self-pay

## 2015-10-01 NOTE — Telephone Encounter (Signed)
Patient would like a sooner appointment. They had him scheduled for June 8th.

## 2015-10-24 ENCOUNTER — Ambulatory Visit (INDEPENDENT_AMBULATORY_CARE_PROVIDER_SITE_OTHER): Payer: BLUE CROSS/BLUE SHIELD | Admitting: Sports Medicine

## 2015-10-24 ENCOUNTER — Encounter: Payer: Self-pay | Admitting: Sports Medicine

## 2015-10-24 DIAGNOSIS — S5402XD Injury of ulnar nerve at forearm level, left arm, subsequent encounter: Secondary | ICD-10-CM

## 2015-10-24 NOTE — Assessment & Plan Note (Signed)
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 We are going to set him up with occupational therapy for consideration of construction of a custom splint to prevent contractures.

## 2015-10-24 NOTE — Progress Notes (Signed)
  Subjective:    CC: Follow-up  HPI: Ulnar neuropraxia: Somewhat grim prognosis for recovery of the nerve based on nerve conduction study and neurology's recommendation. He is getting a contracture and understandably needs some occupational therapy.  Past medical history, Surgical history, Family history not pertinant except as noted below, Social history, Allergies, and medications have been entered into the medical record, reviewed, and no changes needed.   Review of Systems: No fevers, chills, night sweats, weight loss, chest pain, or shortness of breath.   Objective:    General: Well Developed, well nourished, and in no acute distress.  Neuro: Alert and oriented x3, extra-ocular muscles intact, sensation grossly intact.  HEENT: Normocephalic, atraumatic, pupils equal round reactive to light, neck supple, no masses, no lymphadenopathy, thyroid nonpalpable.  Skin: Warm and dry, no rashes. Cardiac: Regular rate and rhythm, no murmurs rubs or gallops, no lower extremity edema.  Respiratory: Clear to auscultation bilaterally. Not using accessory muscles, speaking in full sentences. Left hand: Hand of benediction present, with weakness and hypoesthesia in an ulnar nerve distribution.  Impression and Recommendations:

## 2015-11-06 ENCOUNTER — Encounter: Payer: Self-pay | Admitting: Neurology

## 2015-11-06 ENCOUNTER — Ambulatory Visit (INDEPENDENT_AMBULATORY_CARE_PROVIDER_SITE_OTHER): Payer: Self-pay | Admitting: Neurology

## 2015-11-06 ENCOUNTER — Ambulatory Visit (INDEPENDENT_AMBULATORY_CARE_PROVIDER_SITE_OTHER): Payer: BLUE CROSS/BLUE SHIELD | Admitting: Neurology

## 2015-11-06 ENCOUNTER — Telehealth: Payer: Self-pay | Admitting: Neurology

## 2015-11-06 DIAGNOSIS — Z0289 Encounter for other administrative examinations: Secondary | ICD-10-CM

## 2015-11-06 DIAGNOSIS — R202 Paresthesia of skin: Secondary | ICD-10-CM

## 2015-11-06 DIAGNOSIS — S43005D Unspecified dislocation of left shoulder joint, subsequent encounter: Secondary | ICD-10-CM

## 2015-11-06 DIAGNOSIS — S5402XD Injury of ulnar nerve at forearm level, left arm, subsequent encounter: Secondary | ICD-10-CM | POA: Diagnosis not present
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
## 2015-11-06 NOTE — Telephone Encounter (Signed)
error 

## 2015-11-06 NOTE — Progress Notes (Signed)
Please refer to EMG and nerve conduction study procedure note. 

## 2015-11-12 ENCOUNTER — Ambulatory Visit: Payer: BLUE CROSS/BLUE SHIELD | Attending: Sports Medicine | Admitting: Occupational Therapy

## 2015-11-12 DIAGNOSIS — M6281 Muscle weakness (generalized): Secondary | ICD-10-CM | POA: Insufficient documentation

## 2015-11-12 DIAGNOSIS — R208 Other disturbances of skin sensation: Secondary | ICD-10-CM | POA: Insufficient documentation

## 2015-11-12 DIAGNOSIS — R278 Other lack of coordination: Secondary | ICD-10-CM | POA: Diagnosis present

## 2015-11-12 NOTE — Patient Instructions (Signed)
Your Splint This splint should initially be fitted by a healthcare practitioner.  The healthcare practitioner is responsible for providing wearing instructions and precautions to the patient, other healthcare practitioners and care provider involved in the patient's care.  This splint was custom made for you. Please read the following instructions to learn about wearing and caring for your splint.  Precautions Should your splint cause any of the following problems, remove the splint immediately and contact your therapist/physician.  Swelling  Severe Pain  Pressure Areas  Stiffness  Numbness  Do not wear your splint while operating machinery unless it has been fabricated for that purpose.  When To Wear Your Splint Where your splint according to your therapist/physician instructions. Daytime for 1 hours  Then perform skin check, if problems remove splint and stop wearing, if no problems gradually increase splint wear time to several hours at a time. Once you have been able to tolerate splint without problems for 4-6 hours during day, you may progress to nighttime wear.  Care and Cleaning of Your Splint 1. Keep your splint away from open flames. 2. Your splint will lose its shape in temperatures over 135 degrees Farenheit, ( in car windows, near radiators, ovens or in hot water).  Never make any adjustments to your splint, if the splint needs adjusting remove it and make an appointment to see your therapist. 3. Your splint, including the cushion liner may be cleaned with soap and lukewarm water.  Do not immerse in hot water over 135 degrees Farenheit. 4. Straps may be washed with soap and water, but do not moisten the self-adhesive portion.
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
## 2015-11-27 ENCOUNTER — Ambulatory Visit: Payer: BLUE CROSS/BLUE SHIELD | Admitting: Occupational Therapy

## 2015-11-27 DIAGNOSIS — R278 Other lack of coordination: Secondary | ICD-10-CM

## 2015-11-27 DIAGNOSIS — R208 Other disturbances of skin sensation: Secondary | ICD-10-CM

## 2015-11-27 DIAGNOSIS — M6281 Muscle weakness (generalized): Secondary | ICD-10-CM | POA: Diagnosis not present

## 2015-11-27 NOTE — Therapy (Signed)
[facility] [address] [place], [place], [address] Phone: [phone]   Fax:  [phone]  Occupational Therapy Treatment  Patient Details  Name: Scott Herring MRN: [id number] Date of Birth: 03-24-1991 No Data Recorded  Encounter Date: 11/27/2015      OT End of Session - 11/27/15 2000    Visit Number 2   Number of Visits 9   Date for OT Re-Evaluation 02/09/16   Authorization Type BCBS   OT Start Time 0804   OT Stop Time 0854   OT Time Calculation (min) 50 min   Activity Tolerance Patient tolerated treatment well   Behavior During Therapy [facility] for tasks assessed/performed      No past medical history on file.  No past surgical history on file.  There were no vitals filed for this visit.      Subjective Assessment - 11/27/15 2011    Subjective  reports splint fits well   Pertinent History see Epic   Patient Stated Goals improve use of R hand, splint to prevent clawing   Currently in Pain? Yes   Pain Score 3    Pain Location Elbow   Pain Orientation Left   Pain Descriptors / Indicators Aching   Pain Type Neuropathic pain   Pain Onset More than a month ago   Pain Frequency Intermittent   Aggravating Factors  unknown   Pain Relieving Factors reposistioning       Treatment: Therapist assessed fit of anti-claw splint and replaced existing padding and added padding to palmar portion for a more snug fit.  Pt was instructed in HEP, MP flexion and finger abduction/ adduction. Therapist fabricated a splint for maintaining IP extension during MP flexion.                         OT Education - 11/27/15 2005    Education provided Yes   Education Details HEP and exercise splint wear   Person(s) Educated Patient   Methods Explanation;Demonstration;Verbal cues   Comprehension Verbalized understanding;Returned demonstration;Verbal cues required          OT Short Term Goals - 11/13/15 0835     OT SHORT TERM GOAL #1   Title I with splint wear, care and precautions  and positioning to minimize symptoms/ risk for injury ck 12/30/15   Time 6   Period Weeks   Status New   OT SHORT TERM GOAL #2   Title I with inital HEP   Time 6   Period Weeks   Status New           OT Long Term Goals - 11/13/15 0836    OT LONG TERM GOAL #1   Title I with updated HEP   Time 12   Period Weeks   Status New   OT LONG TERM GOAL #2   Title Pt will demonstrate LUE grip strength of 25 lbs or greater for increased functional use.   Time 12   Period Weeks   Status New   OT LONG TERM GOAL #3   Title Pt will resume use of LUE as a non dominant assist for ADLS/ Works activities at least 90% of the time with pain less than or equal to 3/10.   Time 12   Period Weeks   Status New               Plan - 11/27/15 2005    Clinical Impression Statement Pt reports  splint is fitting well with no problems. He is able to wear splint overnight.   Rehab Potential Good   OT Frequency Other (comment)  8 visits   OT Duration 12 weeks   Plan check HEP, consider resting hand splint for night time wear   Consulted and Agree with Plan of Care Patient      Patient will benefit from skilled therapeutic intervention in order to improve the following deficits and impairments:  Decreased coordination, Decreased range of motion, Impaired flexibility, Impaired sensation, Increased edema, Decreased knowledge of precautions, Decreased knowledge of use of DME, Pain, Impaired UE functional use, Decreased strength  Visit Diagnosis: Muscle weakness (generalized)  Other disturbances of skin sensation  Other lack of coordination    Problem List Patient Active Problem List   Diagnosis Date Noted  . Dislocation of left shoulder joint 09/01/2015  . 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 Acne vulgaris 03/03/2015  . Cataract, left 11/27/2013    RINE,KATHRYN 11/27/2015, 8:12 PM Keene BreathKathryn Rine, OTR/L [phone] Phone: [phone] 8:12 PM 11/27/2015 [facility] [address] [place], [place], [address] Phone: [phone]   Fax:  [phone]  Name: Scott Herring MRN: [id number] Date of Birth: 1991/01/26

## 2015-11-27 NOTE — Patient Instructions (Addendum)
Wear exercise splint, bend at big knuckles(MP joints) 10 -20 reps 3-4x day Place hand on table, spread fingers then bring then together, support wrist. 10-20 reps 3-4x day Pad elbow with exercises.

## 2015-12-01 ENCOUNTER — Encounter: Payer: BLUE CROSS/BLUE SHIELD | Admitting: Occupational Therapy

## 2015-12-04 ENCOUNTER — Ambulatory Visit: Payer: BLUE CROSS/BLUE SHIELD | Attending: Sports Medicine | Admitting: Occupational Therapy

## 2015-12-04 DIAGNOSIS — M6281 Muscle weakness (generalized): Secondary | ICD-10-CM | POA: Diagnosis not present

## 2015-12-04 DIAGNOSIS — R208 Other disturbances of skin sensation: Secondary | ICD-10-CM | POA: Insufficient documentation

## 2015-12-04 DIAGNOSIS — R278 Other lack of coordination: Secondary | ICD-10-CM

## 2015-12-04 NOTE — Therapy (Signed)
[facility] [address] [place], [place], [address] Phone: [phone]   Fax:  [phone]  Occupational Therapy Treatment  Patient Details  Name: Scott Herring MRN: [id number] Date of Birth: 02-26-1991 No Data Recorded  Encounter Date: 12/04/2015      OT End of Session - 12/04/15 1149    Visit Number 3   Number of Visits 9   Date for OT Re-Evaluation 02/09/16   Authorization Type BCBS   OT Start Time 1050   OT Stop Time 1145   OT Time Calculation (min) 55 min   Activity Tolerance Patient tolerated treatment well   Behavior During Therapy [facility] for tasks assessed/performed      No past medical history on file.  No past surgical history on file.  There were no vitals filed for this visit.      Subjective Assessment - 12/04/15 1154    Subjective  "I feel like I am cheating"   Pertinent History see Epic   Patient Stated Goals improve use of R hand, splint to prevent clawing   Currently in Pain? No/denies   Pain Onset More than a month ago        Isolated MP flex with PIPs blocked in ext x25.  Finger adduction/abduction on table with attempts to block wrist x20 and unsupported x15.  Pt reports that nerve damage occurred at elbow.  Reviewed recommendations to avoid bending elbow for prolonged periods (sleeping positions), avoid compression/leaning on elbow and discussed how to do so with work activities.  Pt verbalized understanding.  Pt reports that he has neoprene elbow pad that he wears and turns it to help prevent elbow flex at night as well.    Reviewed importance of avoiding MP hyperextension/clawing with activities and stretching.  Pt verbalized understanding.  Replaced terrycloth padding on anti-claw splint as padding was soiled.                        OT Education - 12/04/15 1149    Education Details Added red putty HEP--see pt instructions   Person(s) Educated Patient   Methods  Explanation;Demonstration;Verbal cues;Handout;Tactile cues   Comprehension Verbalized understanding;Returned demonstration;Verbal cues required          OT Short Term Goals - 12/04/15 1153    OT SHORT TERM GOAL #1   Title I with splint wear, care and precautions  and positioning to minimize symptoms/ risk for injury ck 12/30/15   Time 6   Period Weeks   Status New   OT SHORT TERM GOAL #2   Title I with inital HEP   Time 6   Period Weeks   Status New           OT Long Term Goals - 11/13/15 0836    OT LONG TERM GOAL #1   Title I with updated HEP   Time 12   Period Weeks   Status New   OT LONG TERM GOAL #2   Title Pt will demonstrate LUE grip strength of 25 lbs or greater for increased functional use.   Time 12   Period Weeks   Status New   OT LONG TERM GOAL #3   Title Pt will resume use of LUE as a non dominant assist for ADLS/ Works activities at least 90% of the time with pain less than or equal to 3/10.   Time 12   Period Weeks   Status New  Plan - 12/04/15 1150    Clinical Impression Statement Pt reports splint is fitting well with no problems.  Pt reports doing HEP previously issued.  Pt had to cancel appt next week [place] to [place] to [place].   Rehab Potential Good   OT Frequency Other (comment)  8 visits   OT Duration 12 weeks   Plan review HEP   OT Home Exercise Plan Education provided:  MP block splint wear/care, isolated MP flex with splint/finger adduction, red putty HEP   Consulted and Agree with Plan of Care Patient      Patient will benefit from skilled therapeutic intervention in order to improve the following deficits and impairments:  Decreased coordination, Decreased range of motion, Impaired flexibility, Impaired sensation, Increased edema, Decreased knowledge of precautions, Decreased knowledge of use of DME, Pain, Impaired UE functional use, Decreased strength  Visit Diagnosis: Muscle weakness (generalized)  Other  disturbances of skin sensation  Other lack of coordination    Problem List Patient Active Problem List   Diagnosis Date Noted  . Dislocation of left shoulder joint 09/01/2015  . 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 Acne vulgaris 03/03/2015  . Cataract, left 11/27/2013    [facility]AN,Khi Mcmillen 12/04/2015, 11:55 AM  [facility] [address] [place], [place], [address] Phone: [phone]   Fax:  [phone]  Name: Scott Herring MRN: [id number] Date of Birth: 1991-04-18  Willa FraterAngela Rayel Santizo, OTR/L [facility] [address] [place], [place]  [address] [phone] phone [phone] 12/04/2015 11:55 AM

## 2015-12-04 NOTE — Patient Instructions (Signed)
1. Grip Strengthening (Resistive Putty)   Squeeze putty using thumb and all fingers. Repeat 15 times. Do 1 sessions per day.   Extension (Assistive Putty)   Roll putty back and forth, being sure to use all fingertips. Repeat 4 times. Do 1 sessions per day.  Then pinch as below.   Palmar Pinch Strengthening (Resistive Putty)   Pinch putty between thumb and each fingertip in turn after rolling out      MP Flexion (Resistive Putty)   Bending only at large knuckles, press putty down against thumb. Keep fingertips straight. (block ring/little finger at middle joints).  Do not use too much  Repeat 10 times. Do 1 sessions per day.   Wear splint, flatten putty on table.  Do not use too much force.  10 times, 1x/day    Wrap putty around middle/tip joints with middle/tip joints bent.  Block big knuckles to prevent hyperextension.  Straighten fingers.  10 times, 1 time/day

## 2015-12-11 ENCOUNTER — Encounter: Payer: BLUE CROSS/BLUE SHIELD | Admitting: Occupational Therapy

## 2015-12-16 ENCOUNTER — Other Ambulatory Visit: Payer: Self-pay | Admitting: Family Medicine

## 2015-12-17 ENCOUNTER — Ambulatory Visit: Payer: BLUE CROSS/BLUE SHIELD | Admitting: Occupational Therapy

## 2015-12-17 DIAGNOSIS — M6281 Muscle weakness (generalized): Secondary | ICD-10-CM | POA: Diagnosis not present

## 2015-12-17 DIAGNOSIS — R278 Other lack of coordination: Secondary | ICD-10-CM

## 2015-12-17 DIAGNOSIS — R208 Other disturbances of skin sensation: Secondary | ICD-10-CM

## 2015-12-18 NOTE — Therapy (Signed)
[facility] [address] [place], [place], [address] Phone: [phone]   Fax:  [phone]  Occupational Therapy Treatment  Patient Details  Name: Scott DelaineHenry Herring MRN: [id number] Date of Birth: [birth year] No Data Recorded  Encounter Date: 12/17/2015      OT End of Session - 12/18/15 0814    Visit Number 4   Number of Visits 9   Date for OT Re-Evaluation 02/09/16   Authorization Type BCBS   OT Start Time 1620   OT Stop Time 1709   OT Time Calculation (min) 49 min   Activity Tolerance Patient tolerated treatment well   Behavior During Therapy [facility] for tasks assessed/performed      No past medical history on file.  No past surgical history on file.  There were no vitals filed for this visit.      Subjective Assessment - 12/18/15 0818    Pertinent History see Epic   Patient Stated Goals improve use of R hand, splint to prevent clawing   Currently in Pain? No/denies           Treatment: Therapist fabricated a nighttime resting hand splint with MP's in slight extension and IP's extended to prevent clawing. Pt was educated in splint wear / care and precautions. Review of HEP for MP flexion and finger adduction/ abduction. Padding replaced on anti claw splint as it is dirty and worn.                       OT Short Term Goals - 12/18/15 0817    OT SHORT TERM GOAL #1   Title I with splint wear, care and precautions  and positioning to minimize symptoms/ risk for injury ck 12/30/15   Time 6   Period Weeks   Status Achieved   OT SHORT TERM GOAL #2   Title I with inital HEP   Time 6   Period Weeks   Status Achieved           OT Long Term Goals - 11/13/15 [id number]    OT LONG TERM GOAL #1   Title I with updated HEP   Time 12   Period Weeks   Status New   OT LONG TERM GOAL #2   Title Pt will demonstrate LUE grip strength of 25 lbs or greater for increased functional use.   Time 12   Period Weeks   Status New   OT LONG TERM GOAL #3   Title Pt will resume use of LUE as a non dominant assist for ADLS/ Works activities at least 90% of the time with pain less than or equal to 3/10.   Time 12   Period Weeks   Status New               Plan - 12/18/15 0809    Clinical Impression Statement Pt is progressing towards goals. Therapist replaced padding on anticlaw splint.   Rehab Potential Good   OT Frequency --  8 visits   OT Duration 12 weeks   OT Treatment/Interventions Therapeutic exercise;Patient/family education;Splinting;Ultrasound;Neuromuscular education;Manual Therapy;Energy conservation;Therapeutic exercises;Cryotherapy;Parrafin;DME and/or AE instruction;Therapeutic activities;Fluidtherapy;Electrical Stimulation;Moist Heat;Contrast Bath;Passive range of motion   Plan splint check, anticipate placing therapy on hold for approx 1 month   OT Home Exercise Plan Education provided:  MP block splint wear/care, isolated MP flex with splint/finger adduction, red putty HEP   Consulted and Agree with Plan of Care Patient      Patient will benefit  from skilled therapeutic intervention in order to improve the following deficits and impairments:  Decreased coordination, Decreased range of motion, Impaired flexibility, Impaired sensation, Increased edema, Decreased knowledge of precautions, Decreased knowledge of use of DME, Pain, Impaired UE functional use, Decreased strength  Visit Diagnosis: Muscle weakness (generalized)  Other disturbances of skin sensation  Other lack of coordination    Problem List Patient Active Problem List   Diagnosis Date Noted  . Dislocation of left shoulder joint 09/01/2015  . 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 Acne vulgaris 03/03/2015  . Cataract, left 11/27/2013    Scott Herring 12/18/2015, 8:18 AM Keene Breath, OTR/L [phone] Phone: [phone] 8:18 AM 07/20/2017[facility] [address] [place], [place], [address] Phone: [phone]   Fax:  [phone]  Name: Scott Herring MRN: [id number] Date of Birth: [birth year]

## 2015-12-19 ENCOUNTER — Other Ambulatory Visit: Payer: Self-pay | Admitting: Family Medicine

## 2015-12-24 ENCOUNTER — Encounter: Payer: BLUE CROSS/BLUE SHIELD | Admitting: Occupational Therapy

## 2015-12-30 ENCOUNTER — Ambulatory Visit: Payer: BLUE CROSS/BLUE SHIELD | Attending: Sports Medicine | Admitting: Occupational Therapy

## 2015-12-30 DIAGNOSIS — R208 Other disturbances of skin sensation: Secondary | ICD-10-CM | POA: Insufficient documentation

## 2015-12-30 DIAGNOSIS — R278 Other lack of coordination: Secondary | ICD-10-CM | POA: Diagnosis present

## 2015-12-30 DIAGNOSIS — M6281 Muscle weakness (generalized): Secondary | ICD-10-CM

## 2015-12-30 NOTE — Therapy (Signed)
[facility] [address] [place], [place], [address] Phone: [phone]   Fax:  [phone]  Occupational Therapy Treatment  Patient Details  Name: Scott Herring MRN: [id number] Date of Birth: 1990/06/04 No Data Recorded  Encounter Date: 12/30/2015      OT End of Session - 12/30/15 1712    Visit Number 5   Number of Visits 9   Authorization Type BCBS   OT Start Time 1540   OT Stop Time 1645   OT Time Calculation (min) 65 min   Activity Tolerance Patient tolerated treatment well   Behavior During Therapy [facility] for tasks assessed/performed      No past medical history on file.  No past surgical history on file.  There were no vitals filed for this visit.           Review of HEP for MP flexion and finger adduction/ abduction. Padding replaced on anti claw splint as it is dirty and worn. Therapist fabricated an additional anti claw splint as a backup as pt's splint is becoming worn. Therapist  reviewed splint wear care and precautions. Pt verbalized understanding. Pt reports he woke up yesterday with wrist pain while wearing resting hand splint. Pt reports he may have slept on arm or had straps too tight. Therapist encouraged pt to ensure that he could get a finger under the strap when wearing to ensure splint was not too tight. Pt reports wearing splint overnight several other nights with out incident. Therapsit checked progress towards goals.                 OT Education - 12/30/15 1725    Education provided Yes   Education Details splint wear, care and precautions   Person(s) Educated Patient   Methods Explanation;Demonstration;Verbal cues   Comprehension Verbalized understanding;Returned demonstration          OT Short Term Goals - 12/18/15 0817      OT SHORT TERM GOAL #1   Title I with splint wear, care and precautions  and positioning to minimize symptoms/ risk for injury ck 12/30/15    Time 6   Period Weeks   Status Achieved     OT SHORT TERM GOAL #2   Title I with inital HEP   Time 6   Period Weeks   Status Achieved           OT Long Term Goals - 12/30/15 1641      OT LONG TERM GOAL #1   Title I with updated HEP   Time 12   Period Weeks   Status On-going     OT LONG TERM GOAL #2   Title Pt will demonstrate LUE grip strength of 25 lbs or greater for increased functional use. upgraded goal: Pt will demonstrate grip strength of 75 lbs for increased functional use.   Time 12   Period Weeks   Status Revised  55, 50 lbs     OT LONG TERM GOAL #3   Title Pt will resume use of LUE as a non dominant assist for ADLS/ Works activities at least 90% of the time with pain less than or equal to 3/10.   Time 12   Period Weeks   Status On-going  uses 20%               Plan - 12/30/15 1715    Clinical Impression Statement Pt demonstrates progress with increased LUE grip strength. Pt to be placed on hold  for 6 weeks in order to allow for nerve regeneration.    Rehab Potential Good   OT Frequency --  8 visits   OT Duration 12 weeks   OT Treatment/Interventions Therapeutic exercise;Patient/family education;Splinting;Ultrasound;Neuromuscular education;Manual Therapy;Energy conservation;Therapeutic exercises;Cryotherapy;Parrafin;DME and/or AE instruction;Therapeutic activities;Fluidtherapy;Electrical Stimulation;Moist Heat;Contrast Bath;Passive range of motion   Plan Pt to be placed on hold for approx 6 weeks, unless he has a change in status or splinting needs   OT Home Exercise Plan Education provided:  MP block splint wear/care, isolated MP flex with splint/finger adduction, red putty HEP   Consulted and Agree with Plan of Care Patient      Patient will benefit from skilled therapeutic intervention in order to improve the following deficits and impairments:  Decreased coordination, Decreased range of motion, Impaired flexibility, Impaired sensation,  Increased edema, Decreased knowledge of precautions, Decreased knowledge of use of DME, Pain, Impaired UE functional use, Decreased strength  Visit Diagnosis: Muscle weakness (generalized)  Other disturbances of skin sensation  Other lack of coordination    Problem List Patient Active Problem List   Diagnosis Date Noted  . Dislocation of left shoulder joint 09/01/2015  . 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 Acne vulgaris 03/03/2015  . Cataract, left 11/27/2013    RINE,KATHRYN 12/30/2015, 5:26 PM Keene Breath, OTR/L [phone] Phone: [phone] 5:26 PM 12/30/15 [facility] [address] [place], [place], [address] Phone: [phone]   Fax:  [phone]  Name: Scott Herring MRN: [id number] Date of Birth: May 16, 1991

## 2016-01-23 ENCOUNTER — Ambulatory Visit (INDEPENDENT_AMBULATORY_CARE_PROVIDER_SITE_OTHER): Payer: BLUE CROSS/BLUE SHIELD | Admitting: Sports Medicine

## 2016-01-23 DIAGNOSIS — S5402XD Injury of ulnar nerve at forearm level, left arm, subsequent encounter: Secondary | ICD-10-CM

## 2016-01-23 NOTE — Assessment & Plan Note (Signed)
Likely permanent, he does have a custom splint, also has an appointment with a neurosurgeon coming up to discuss grafting.

## 2016-01-23 NOTE — Progress Notes (Signed)
  Subjective:    CC: Follow-up  HPI: Left ulnar neuropathy: As a bit more movement but really no improvement in sensation.  Has already been evaluated by multidisciplinary team, he does have a visit with the neurosurgeon coming up to discuss grafting.  Past medical history, Surgical history, Family history not pertinant except as noted below, Social history, Allergies, and medications have been entered into the medical record, reviewed, and no changes needed.   Review of Systems: No fevers, chills, night sweats, weight loss, chest pain, or shortness of breath.   Objective:    General: Well Developed, well nourished, and in no acute distress.  Neuro: Alert and oriented x3, extra-ocular muscles intact, sensation grossly intact.  HEENT: Normocephalic, atraumatic, pupils equal round reactive to light, neck supple, no masses, no lymphadenopathy, thyroid nonpalpable.  Skin: Warm and dry, no rashes. Cardiac: Regular rate and rhythm, no murmurs rubs or gallops, no lower extremity edema.  Respiratory: Clear to auscultation bilaterally. Not using accessory muscles, speaking in full sentences.  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

## 2016-02-10 ENCOUNTER — Ambulatory Visit: Payer: BLUE CROSS/BLUE SHIELD | Attending: Sports Medicine | Admitting: Occupational Therapy

## 2016-02-10 DIAGNOSIS — R278 Other lack of coordination: Secondary | ICD-10-CM | POA: Diagnosis present

## 2016-02-10 DIAGNOSIS — M6281 Muscle weakness (generalized): Secondary | ICD-10-CM

## 2016-02-10 DIAGNOSIS — R208 Other disturbances of skin sensation: Secondary | ICD-10-CM | POA: Diagnosis present

## 2016-02-11 NOTE — Therapy (Signed)
[facility] [address] [place], [place], [address] Phone: [phone]   Fax:  [phone]  Occupational Therapy Treatment  Patient Details  Name: Scott Herring MRN: [id number] Date of Birth: 02/05/1991 No Data Recorded  Encounter Date: 02/10/2016      OT End of Session - 02/11/16 0834    Visit Number 6   Number of Visits 9   Date for OT Re-Evaluation 02/09/16   Authorization Type BCBS   OT Start Time 1545   OT Stop Time 1630   OT Time Calculation (min) 45 min   Activity Tolerance Patient tolerated treatment well   Behavior During Therapy [facility] for tasks assessed/performed      No past medical history on file.  No past surgical history on file.  There were no vitals filed for this visit.      Subjective Assessment - 02/11/16 0932    Subjective  Pt reports Dr. Tamera Punt would like for him to see his hand specialist    Pertinent History see Epic   Patient Stated Goals improve use of R hand, splint to prevent clawing   Currently in Pain? No/denies              Treatment: Pt requested modification of existing anti-claw splint for increased functional use of hand. Therapsit fabricated a new splint to allow improved grasp. Pt reported increased comfort and ability to use hand with new splint. Therapist modified existing splint and applied new padding for comfort.  Therapist checked progress towards goals. Pt to discharge from OT at this site as Dr. Tamera Punt wants pt to see his hand specialist at his site. Pt agrees with plans for d/c.                  OT Short Term Goals - 12/18/15 0817      OT SHORT TERM GOAL #1   Title I with splint wear, care and precautions  and positioning to minimize symptoms/ risk for injury ck 12/30/15   Time 6   Period Weeks   Status Achieved     OT SHORT TERM GOAL #2   Title I with inital HEP   Time 6   Period Weeks   Status Achieved           OT Long  Term Goals - 02/10/16 1624      OT LONG TERM GOAL #1   Title I with updated HEP   Time 12   Period Weeks   Status Achieved     OT LONG TERM GOAL #2   Title Pt will demonstrate LUE grip strength of 25 lbs or greater for increased functional use. upgraded goal: Pt will demonstrate grip strength of 75 lbs for increased functional use.   Time 12   Period Weeks   Status Not Met  inital goal met, upgraded not met, 50 lbs grip     OT LONG TERM GOAL #3   Title Pt will resume use of LUE as a non dominant assist for ADLS/ Works activities at least 90% of the time with pain less than or equal to 3/10.   Time 12   Period Weeks   Status Not Met  70%               Plan - 02/11/16 0823    Clinical Impression Statement Therapist fabricated a new anti-claw splint to allow improved functional use of hand and replaced padding/ modified previous anti claw splint.  Rehab Potential Good   OT Frequency --  8 visits   OT Duration 12 weeks   OT Treatment/Interventions Therapeutic exercise;Patient/family education;Splinting;Ultrasound;Neuromuscular education;Manual Therapy;Energy conservation;Therapeutic exercises;Cryotherapy;Parrafin;DME and/or AE instruction;Therapeutic activities;Fluidtherapy;Electrical Stimulation;Moist Heat;Contrast Bath;Passive range of motion   Plan discharge OT   OT Home Exercise Plan Education provided:  MP block splint wear/care, isolated MP flex with splint/finger adduction, red putty HEP   Consulted and Agree with Plan of Care Patient      Patient will benefit from skilled therapeutic intervention in order to improve the following deficits and impairments:  Decreased coordination, Decreased range of motion, Impaired flexibility, Impaired sensation, Increased edema, Decreased knowledge of precautions, Decreased knowledge of use of DME, Pain, Impaired UE functional use, Decreased strength  Visit Diagnosis: Muscle weakness (generalized)  Other disturbances of skin  sensation  Other lack of coordination   OCCUPATIONAL THERAPY DISCHARGE SUMMARY   Current functional level related to goals / functional outcomes: See above, pt made overall gains yet did not fully meet goals due to severity of deficits.   Remaining deficits: Decreased A/ROM, decreased strength   Education / Equipment: Pt was educated in splint wear, care and precautions and HEP. Pt verbalized understanding of all education. Pt is being d/c as progress has plateaued and MD(Dr. Tamera Punt) would like for pt to see his hand specialist. Plan: Patient agrees to discharge.  Patient goals were partially met.                                                                                                      ?????     Problem List Patient Active Problem List   Diagnosis Date Noted  . Dislocation of left shoulder joint 09/01/2015  . 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 Acne vulgaris 03/03/2015  . Cataract, left 11/27/2013    Herring,Scott 02/11/2016, 9:56 AM Theone Murdoch, OTR/L [phone] Phone: [phone] 10:06 AM 02/11/16 [facility] [address] [place], [place], [address] Phone: [phone]   Fax:  [phone]  Name: Scott Herring MRN: [id number] Date of Birth: Mar 07, 1991

## 2016-02-16 ENCOUNTER — Ambulatory Visit (INDEPENDENT_AMBULATORY_CARE_PROVIDER_SITE_OTHER): Payer: BLUE CROSS/BLUE SHIELD | Admitting: Sports Medicine

## 2016-02-16 VITALS — BP 135/77 | HR 59 | Resp 18 | Ht 71.0 in | Wt 202.8 lb

## 2016-02-16 DIAGNOSIS — Z0000 Encounter for general adult medical examination without abnormal findings: Secondary | ICD-10-CM | POA: Diagnosis not present

## 2016-02-16 DIAGNOSIS — Z23 Encounter for immunization: Secondary | ICD-10-CM | POA: Diagnosis not present

## 2016-02-16 DIAGNOSIS — Z299 Encounter for prophylactic measures, unspecified: Secondary | ICD-10-CM | POA: Diagnosis not present

## 2016-02-16 NOTE — Addendum Note (Signed)
Addended by: Baird KayUGLAS, Aaleah Hirsch M on: 02/16/2016 04:46 PM   Modules accepted: Orders

## 2016-02-16 NOTE — Assessment & Plan Note (Signed)
Complete physical as above, he gets his blood work done with his work, Tdap today. Return in one year.

## 2016-02-16 NOTE — Progress Notes (Signed)
  Subjective:    CC: Complete physical exam  HPI:  This is a pleasant 25 year old male, he is here for his complete physical. No complaints.  Past medical history, Surgical history, Family history not pertinant except as noted below, Social history, Allergies, and medications have been entered into the medical record, reviewed, and no changes needed.   Review of Systems: No headache, visual changes, nausea, vomiting, diarrhea, constipation, dizziness, abdominal pain, skin rash, fevers, chills, night sweats, swollen lymph nodes, weight loss, chest pain, body aches, joint swelling, muscle aches, shortness of breath, mood changes, visual or auditory hallucinations.  Objective:    General: Well Developed, well nourished, and in no acute distress.  Neuro: Alert and oriented x3, extra-ocular muscles intact, sensation grossly intact. Cranial nerves II through XII are intact, motor, sensory, and coordinative functions are all intact. HEENT: Normocephalic, atraumatic, pupils equal round reactive to light, neck supple, no masses, no lymphadenopathy, thyroid nonpalpable. Oropharynx, nasopharynx, external ear canals are unremarkable. Skin: Warm and dry, no rashes noted.  Cardiac: Regular rate and rhythm, no murmurs rubs or gallops.  Respiratory: Clear to auscultation bilaterally. Not using accessory muscles, speaking in full sentences.  Abdominal: Soft, nontender, nondistended, positive bowel sounds, no masses, no organomegaly.  Musculoskeletal: Shoulder, elbow, wrist, hip, knee, ankle stable, and with full range of motion.  Impression and Recommendations:    The patient was counselled, risk factors were discussed, anticipatory guidance given.  Annual physical exam Complete physical as above, he gets his blood work done with his work, Tdap today. Return in one year.

## 2016-02-25 ENCOUNTER — Telehealth: Payer: Self-pay | Admitting: Neurology

## 2016-02-25 NOTE — Telephone Encounter (Signed)
Phone note sent to [name].

## 2016-02-25 NOTE — Telephone Encounter (Signed)
Allissa/[facility] [phone] called, states she received the EMG but not the NCS numbers part of it for patient.

## 2016-03-31 ENCOUNTER — Other Ambulatory Visit (HOSPITAL_COMMUNITY): Payer: Self-pay | Admitting: Orthopedic Surgery

## 2016-03-31 DIAGNOSIS — M25522 Pain in left elbow: Secondary | ICD-10-CM

## 2016-04-06 ENCOUNTER — Ambulatory Visit (HOSPITAL_COMMUNITY)
Admission: RE | Admit: 2016-04-06 | Discharge: 2016-04-06 | Disposition: A | Discharge status: Home / Self Care | Payer: BLUE CROSS/BLUE SHIELD | Source: Ambulatory Visit | Attending: Orthopedic Surgery | Admitting: Orthopedic Surgery

## 2016-04-06 DIAGNOSIS — M25522 Pain in left elbow: Secondary | ICD-10-CM | POA: Diagnosis present

## 2016-04-16 ENCOUNTER — Telehealth: Payer: Self-pay

## 2016-04-16 NOTE — Telephone Encounter (Signed)
Pt left VM stating when he had his physical it wasn't coded properly and he's been unable to get his insurance discount. Please assist.

## 2016-04-16 NOTE — Telephone Encounter (Signed)
It was billed as an annual physical exam and coded as an annual physical exam, and linked to the diagnosis annual physical exam. Have them let me know what particular diagnosis they would prefer me to link it to?

## 2016-04-20 NOTE — Telephone Encounter (Signed)
Pt notified and will let us know if anything else is needed.

## 2016-04-21 ENCOUNTER — Telehealth: Payer: Self-pay

## 2016-04-21 NOTE — Telephone Encounter (Signed)
Pt left VM stating that the personnel in charge of Wellness states his Physical is not coded correctly and he is unable to get his insurance adjustment. Dr. Benjamin Stainhekkekandam and I verified the codes from his physical on 02/16/16 but is still stating it is incorrect. Please assist.

## 2017-02-15 ENCOUNTER — Ambulatory Visit (INDEPENDENT_AMBULATORY_CARE_PROVIDER_SITE_OTHER): Payer: BLUE CROSS/BLUE SHIELD | Admitting: Sports Medicine

## 2017-02-15 ENCOUNTER — Encounter: Payer: Self-pay | Admitting: Sports Medicine

## 2017-02-15 DIAGNOSIS — Z7184 Encounter for health counseling related to travel: Secondary | ICD-10-CM | POA: Insufficient documentation

## 2017-02-15 DIAGNOSIS — Z0000 Encounter for general adult medical examination without abnormal findings: Secondary | ICD-10-CM

## 2017-02-15 DIAGNOSIS — Z7189 Other specified counseling: Secondary | ICD-10-CM

## 2017-02-15 DIAGNOSIS — S6402XD Injury of ulnar nerve at wrist and hand level of left arm, subsequent encounter: Secondary | ICD-10-CM | POA: Diagnosis not present

## 2017-02-15 MED ORDER — TYPHOID VACCINE PO CPDR: 1.0000 | DELAYED_RELEASE_CAPSULE | ORAL | 0 refills | Status: DC

## 2017-02-15 MED ORDER — CIPROFLOXACIN HCL 500 MG PO TABS: 500.0000 mg | ORAL_TABLET | Freq: Two times a day (BID) | ORAL | 0 refills | Status: DC

## 2017-02-15 NOTE — Assessment & Plan Note (Signed)
Going to [place], even the malaria is endemic in the region he will be traveling to, the recommendations from the [facility] website are simply avoidance of mosquitoes. Cipro for traveler's diarrhea. He is up-to-date on all routine vaccines. Declines medication for motion sickness. Typhoid vaccine also recommended.

## 2017-02-15 NOTE — Progress Notes (Signed)
  Subjective:    CC: Annual physical  HPI:  Healthy male, here for his routine physical for work, his employer typically gives him the flu vaccine as well as checks routine blood work, he agrees to forward these tests to me.  Ulnar neuropathy: Resolved, did end up having to have an ulnar nerve transposition.  Travel advice: Going to [place], he will be on the [address], this is a malaria endemic area but for the state he is going to, avoidance of mosquitoes is the only recommendation. He will also need traveler's diarrhea prophylaxis, as well as the oral typhoid vaccine.  Past medical history:  Negative.  See flowsheet/record as well for more information.  Surgical history: Negative.  See flowsheet/record as well for more information.  Family history: Negative.  See flowsheet/record as well for more information.  Social history: Negative.  See flowsheet/record as well for more information.  Allergies, and medications have been entered into the medical record, reviewed, and no changes needed.    Review of Systems: No headache, visual changes, nausea, vomiting, diarrhea, constipation, dizziness, abdominal pain, skin rash, fevers, chills, night sweats, swollen lymph nodes, weight loss, chest pain, body aches, joint swelling, muscle aches, shortness of breath, mood changes, visual or auditory hallucinations.  Objective:    General: Well Developed, well nourished, and in no acute distress.  Neuro: Alert and oriented x3, extra-ocular muscles intact, sensation grossly intact. Cranial nerves II through XII are intact, motor, sensory, and coordinative functions are all intact. HEENT: Normocephalic, atraumatic, pupils equal round reactive to light, neck supple, no masses, no lymphadenopathy, thyroid nonpalpable. Oropharynx, nasopharynx, external ear canals are unremarkable. Skin: Warm and dry, no rashes noted.  Cardiac: Regular rate and rhythm, no murmurs rubs or gallops.  Respiratory: Clear to  auscultation bilaterally. Not using accessory muscles, speaking in full sentences.  Abdominal: Soft, nontender, nondistended, positive bowel sounds, no masses, no organomegaly.  Musculoskeletal: Shoulder, elbow, wrist, hip, knee, ankle stable, and with full range of motion.  Impression and Recommendations:    The patient was counselled, risk factors were discussed, anticipatory guidance given.  Annual physical exam Unremarkable annual physical. Patient gets his flu shot and labs done with his job.  Ulnar nerve injury Fantastic improvements, near full function and full resolution of symptoms at this point.  Travel advice encounter Going to [place], even the malaria is endemic in the region he will be traveling to, the recommendations from the [facility] website are simply avoidance of mosquitoes. Cipro for traveler's diarrhea. He is up-to-date on all routine vaccines. Declines medication for motion sickness. Typhoid vaccine also recommended.  ___________________________________________ Ihor Austin. Benjamin Stain, M.D., ABFM., CAQSM. Primary Care and Sports Medicine [facility]  Adjunct Instructor of Family Medicine  [employer]

## 2017-02-15 NOTE — Assessment & Plan Note (Signed)
Unremarkable annual physical. Patient gets his flu shot and labs done with his job.

## 2017-02-15 NOTE — Assessment & Plan Note (Signed)
Fantastic improvements, near full function and full resolution of symptoms at this point.
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
## 2018-02-16 ENCOUNTER — Encounter: Payer: Self-pay | Admitting: Sports Medicine

## 2018-02-16 ENCOUNTER — Ambulatory Visit (INDEPENDENT_AMBULATORY_CARE_PROVIDER_SITE_OTHER): Payer: BLUE CROSS/BLUE SHIELD | Admitting: Sports Medicine

## 2018-02-16 DIAGNOSIS — M7711 Lateral epicondylitis, right elbow: Secondary | ICD-10-CM

## 2018-02-16 DIAGNOSIS — Z0000 Encounter for general adult medical examination without abnormal findings: Secondary | ICD-10-CM

## 2018-02-16 NOTE — Progress Notes (Signed)
Subjective:    CC: Annual physical  HPI:  Scott Herring is here for his physical, his only complaint is pain in his right elbow present for several months, localized over the lateral epicondyle, no radiation, worse with gripping motions, he bought a counterforce brace, and has started some rehab exercises but not really doing them diligently.  Symptoms are mild, intermittent.  I reviewed the past medical history, family history, social history, surgical history, and allergies today and no changes were needed.  Please see the problem list section below in epic for further details.  Past Medical History: No past medical history on file. Past Surgical History: No past surgical history on file. Social History: Social History   Socioeconomic History  . Marital status: Unknown    Spouse name: Not on file  . Number of children: Not on file  . Years of education: Not on file  . Highest education level: Not on file  Occupational History  . Not on file  Social Needs  . Financial resource strain: Not on file  . Food insecurity:    Worry: Not on file    Inability: Not on file  . Transportation needs:    Medical: Not on file    Non-medical: Not on file  Tobacco Use  . Smoking status: Smoker, Current Status Unknown  . Smokeless tobacco: Current User    Types: Chew  Substance and Sexual Activity  . Alcohol use: Yes  . Drug use: No  . Sexual activity: Not on file  Lifestyle  . Physical activity:    Days per week: Not on file    Minutes per session: Not on file  . Stress: Not on file  Relationships  . Social connections:    Talks on phone: Not on file    Gets together: Not on file    Attends religious service: Not on file    Active member of club or organization: Not on file    Attends meetings of clubs or organizations: Not on file    Relationship status: Not on file  Other Topics Concern  . Not on file  Social History Narrative  . Not on file   Family History: No family history  on file. Allergies: No Known Allergies Medications: See med rec.  Review of Systems: No headache, visual changes, nausea, vomiting, diarrhea, constipation, dizziness, abdominal pain, skin rash, fevers, chills, night sweats, swollen lymph nodes, weight loss, chest pain, body aches, joint swelling, muscle aches, shortness of breath, mood changes, visual or auditory hallucinations.  Objective:    General: Well Developed, well nourished, and in no acute distress.  Neuro: Alert and oriented x3, extra-ocular muscles intact, sensation grossly intact. Cranial nerves II through XII are intact, motor, sensory, and coordinative functions are all intact. HEENT: Normocephalic, atraumatic, pupils equal round reactive to light, neck supple, no masses, no lymphadenopathy, thyroid nonpalpable. Oropharynx, nasopharynx, external ear canals are unremarkable. Skin: Warm and dry, no rashes noted.  Cardiac: Regular rate and rhythm, no murmurs rubs or gallops.  Respiratory: Clear to auscultation bilaterally. Not using accessory muscles, speaking in full sentences.  Abdominal: Soft, nontender, nondistended, positive bowel sounds, no masses, no organomegaly.  Right elbow: Unremarkable to inspection. Range of motion full pronation, supination, flexion, extension. Strength is full to all of the above directions Stable to varus, valgus stress. Negative moving valgus stress test. Tenderness at the common extensor tendon origin. Ulnar nerve does not sublux. Negative cubital tunnel Tinel's.    Impression and Recommendations:  The patient was counselled, risk factors were discussed, anticipatory guidance given.  Annual physical exam Routine physical as above. Checking routine labs, he did have his flu shot on Monday.  Right lateral epicondylitis He will get more diligent with his rehab exercises and continue wearing the counterforce brace he purchased. He can return for injection at his  leisure. ___________________________________________ Ihor Austin. Benjamin Stain, M.D., ABFM., CAQSM. Primary Care and Sports Medicine [facility]  Adjunct Instructor of Family Medicine  [employer]

## 2018-02-16 NOTE — Assessment & Plan Note (Signed)
Routine physical as above. Checking routine labs, he did have his flu shot on Monday.

## 2018-02-16 NOTE — Assessment & Plan Note (Signed)
He will get more diligent with his rehab exercises and continue wearing the counterforce brace he purchased. He can return for injection at his leisure.

## 2018-07-17 ENCOUNTER — Telehealth: Payer: Self-pay | Admitting: Sports Medicine

## 2018-07-17 NOTE — Telephone Encounter (Signed)
Patient called and states that he is going out of town this week to [place] and wants to know if he needs to get any immunizations before he goes on his trip. All I see we have on file is Tdap and Flu. Please advise.

## 2018-07-17 NOTE — Telephone Encounter (Signed)
Malaria and typhoid are recommended, if he would like me to I can send these to the pharmacy.  He also needs the routine vaccines, hepatitis A, B, Tdap, flu, MMR, if he up-to-date on these?

## 2018-07-18 NOTE — Telephone Encounter (Signed)
I called patient and he does not want you to send in [facility] or Typhoid. He thinks he is up to date on the other shots. He states that he would talk with you in detail at the next visit.

## 2019-02-14 ENCOUNTER — Telehealth: Payer: Self-pay | Admitting: *Deleted

## 2019-02-14 NOTE — Telephone Encounter (Signed)
Pt left vm wanting to know if he could get a letter to have the window tint on his vehicle darker than what is allowed.  He didn't know if he needed to get this from you or his eye doc.

## 2019-02-14 NOTE — Telephone Encounter (Signed)
Never heard of this, but he may want to contact his eye doctor to see if there is any evidence of photosensitivity and the need for darker tint.

## 2019-02-15 NOTE — Telephone Encounter (Signed)
Pt.notified

## 2019-02-19 ENCOUNTER — Ambulatory Visit (INDEPENDENT_AMBULATORY_CARE_PROVIDER_SITE_OTHER): Payer: Managed Care, Other (non HMO) | Admitting: Sports Medicine

## 2019-02-19 ENCOUNTER — Other Ambulatory Visit: Payer: Self-pay

## 2019-02-19 ENCOUNTER — Encounter: Payer: Self-pay | Admitting: Sports Medicine

## 2019-02-19 VITALS — BP 105/68 | HR 65 | Ht 71.0 in | Wt 205.0 lb

## 2019-02-19 DIAGNOSIS — L409 Psoriasis, unspecified: Secondary | ICD-10-CM | POA: Insufficient documentation

## 2019-02-19 DIAGNOSIS — E559 Vitamin D deficiency, unspecified: Secondary | ICD-10-CM

## 2019-02-19 DIAGNOSIS — L723 Sebaceous cyst: Secondary | ICD-10-CM

## 2019-02-19 DIAGNOSIS — R739 Hyperglycemia, unspecified: Secondary | ICD-10-CM | POA: Diagnosis not present

## 2019-02-19 DIAGNOSIS — Z0000 Encounter for general adult medical examination without abnormal findings: Secondary | ICD-10-CM

## 2019-02-19 MED ORDER — CLOBETASOL PROPIONATE 0.05 % EX OINT: 1.0000 "application " | TOPICAL_OINTMENT | Freq: Two times a day (BID) | CUTANEOUS | 0 refills | Status: DC

## 2019-02-19 NOTE — Assessment & Plan Note (Signed)
Teran will return in a 30-minute slot for excision

## 2019-02-19 NOTE — Assessment & Plan Note (Signed)
Routine physical as above. Checking routine labs.  

## 2019-02-19 NOTE — Assessment & Plan Note (Signed)
Extensor surfaces of both elbows. Adding topical clobetasol to be used twice a day.

## 2019-02-19 NOTE — Progress Notes (Signed)
Subjective:    CC: Annual physical  HPI:  Scott Herring returns, he is here for his physical.  I reviewed the past medical history, family history, social history, surgical history, and allergies today and no changes were needed.  Please see the problem list section below in epic for further details.  Past Medical History: No past medical history on file. Past Surgical History: No past surgical history on file. Social History: Social History   Socioeconomic History   Marital status: Unknown    Spouse name: Not on file   Number of children: Not on file   Years of education: Not on file   Highest education level: Not on file  Occupational History   Not on file  Social Needs   Financial resource strain: Not on file   Food insecurity    Worry: Not on file    Inability: Not on file   Transportation needs    Medical: Not on file    Non-medical: Not on file  Tobacco Use   Smoking status: Smoker, Current Status Unknown   Smokeless tobacco: Current User    Types: Chew  Substance and Sexual Activity   Alcohol use: Yes   Drug use: No   Sexual activity: Not on file  Lifestyle   Physical activity    Days per week: Not on file    Minutes per session: Not on file   Stress: Not on file  Relationships   Social connections    Talks on phone: Not on file    Gets together: Not on file    Attends religious service: Not on file    Active member of club or organization: Not on file    Attends meetings of clubs or organizations: Not on file    Relationship status: Not on file  Other Topics Concern   Not on file  Social History Narrative   Not on file   Family History: No family history on file. Allergies: No Known Allergies Medications: See med rec.  Review of Systems: No headache, visual changes, nausea, vomiting, diarrhea, constipation, dizziness, abdominal pain, skin rash, fevers, chills, night sweats, swollen lymph nodes, weight loss, chest pain, body aches,  joint swelling, muscle aches, shortness of breath, mood changes, visual or auditory hallucinations.  Objective:    General: Well Developed, well nourished, and in no acute distress.  Neuro: Alert and oriented x3, extra-ocular muscles intact, sensation grossly intact. Cranial nerves II through XII are intact, motor, sensory, and coordinative functions are all intact. HEENT: Normocephalic, atraumatic, pupils equal round reactive to light, neck supple, no masses, no lymphadenopathy, thyroid nonpalpable. Oropharynx, nasopharynx, external ear canals are unremarkable. Skin: Warm and dry, erythematous, scaly rash on the extensor surface of both elbows consistent with psoriasis.  3 cm subcutaneous mass in the midline of the mid back consistent with sebaceous cyst. Cardiac: Regular rate and rhythm, no murmurs rubs or gallops.  Respiratory: Clear to auscultation bilaterally. Not using accessory muscles, speaking in full sentences.  Abdominal: Soft, nontender, nondistended, positive bowel sounds, no masses, no organomegaly.  Musculoskeletal: Shoulder, elbow, wrist, hip, knee, ankle stable, and with full range of motion.  Impression and Recommendations:    The patient was counselled, risk factors were discussed, anticipatory guidance given.  Annual physical exam Routine physical as above. Checking routine labs.  Psoriasis Extensor surfaces of both elbows. Adding topical clobetasol to be used twice a day.  Sebaceous cyst of mid back Scott Herring will return in a 30-minute slot for excision  ___________________________________________ Gwen Her. Dianah Field, M.D., ABFM., CAQSM. Primary Care and Sports Medicine [facility] [facility]  Adjunct Professor of [place] [employer]

## 2019-02-20 LAB — COMPLETE METABOLIC PANEL WITH GFR
AG Ratio: 1.5 (calc) (ref 1.0–2.5)
ALT: 33 U/L (ref 9–46)
AST: 25 U/L (ref 10–40)
Albumin: 4.4 g/dL (ref 3.6–5.1)
Alkaline phosphatase (APISO): 64 U/L (ref 36–130)
BUN: 15 mg/dL (ref 7–25)
CO2: 27 mmol/L (ref 20–32)
Calcium: 9.6 mg/dL (ref 8.6–10.3)
Chloride: 99 mmol/L (ref 98–110)
Creat: 1.09 mg/dL (ref 0.60–1.35)
GFR, Est African American: 105 mL/min/{1.73_m2} (ref >=60)
GFR, Est Non African American: 91 mL/min/{1.73_m2} (ref >=60)
Globulin: 2.9 g/dL (calc) (ref 1.9–3.7)
Glucose, Bld: 105 mg/dL — ABNORMAL HIGH (ref 65–99)
Potassium: 4 mmol/L (ref 3.5–5.3)
Sodium: 135 mmol/L (ref 135–146)
Total Bilirubin: 1 mg/dL (ref 0.2–1.2)
Total Protein: 7.3 g/dL (ref 6.1–8.1)

## 2019-02-20 LAB — HEMOGLOBIN A1C
Hgb A1c MFr Bld: 4.8 % of total Hgb (ref <5.7)
Mean Plasma Glucose: 91 (calc)
eAG (mmol/L): 5 (calc)

## 2019-02-20 LAB — CBC
HCT: 43.9 % (ref 38.5–50.0)
Hemoglobin: 15.6 g/dL (ref 13.2–17.1)
MCH: 32.8 pg (ref 27.0–33.0)
MCHC: 35.5 g/dL (ref 32.0–36.0)
MCV: 92.2 fL (ref 80.0–100.0)
MPV: 9.5 fL (ref 7.5–12.5)
Platelets: 321 10*3/uL (ref 140–400)
RBC: 4.76 10*6/uL (ref 4.20–5.80)
RDW: 12.2 % (ref 11.0–15.0)
WBC: 8.4 10*3/uL (ref 3.8–10.8)

## 2019-02-20 LAB — LIPID PANEL W/REFLEX DIRECT LDL
Cholesterol: 186 mg/dL (ref <200)
HDL: 34 mg/dL — ABNORMAL LOW (ref >=40)
LDL Cholesterol (Calc): 106 mg/dL (calc) — ABNORMAL HIGH
Non-HDL Cholesterol (Calc): 152 mg/dL (calc) — ABNORMAL HIGH (ref <130)
Total CHOL/HDL Ratio: 5.5 (calc) — ABNORMAL HIGH (ref <5.0)
Triglycerides: 347 mg/dL — ABNORMAL HIGH (ref <150)

## 2019-02-20 LAB — VITAMIN D 25 HYDROXY (VIT D DEFICIENCY, FRACTURES): Vit D, 25-Hydroxy: 25 ng/mL — ABNORMAL LOW (ref 30–100)

## 2019-02-20 LAB — TSH: TSH: 2.42 mIU/L (ref 0.40–4.50)

## 2019-02-20 MED ORDER — VITAMIN D (ERGOCALCIFEROL) 1.25 MG (50000 UNIT) PO CAPS: 50000.0000 [IU] | ORAL_CAPSULE | ORAL | 0 refills | Status: DC

## 2019-02-20 NOTE — Addendum Note (Signed)
Addended by: Silverio Decamp on: 02/20/2019 10:52 AM   Modules accepted: Orders

## 2019-06-12 ENCOUNTER — Ambulatory Visit: Payer: Managed Care, Other (non HMO) | Admitting: Sports Medicine

## 2019-06-18 ENCOUNTER — Other Ambulatory Visit: Payer: Self-pay

## 2019-06-18 ENCOUNTER — Ambulatory Visit (INDEPENDENT_AMBULATORY_CARE_PROVIDER_SITE_OTHER): Payer: No Typology Code available for payment source | Admitting: Sports Medicine

## 2019-06-18 DIAGNOSIS — L723 Sebaceous cyst: Secondary | ICD-10-CM

## 2019-06-18 MED ORDER — HYDROCODONE-ACETAMINOPHEN 10-325 MG PO TABS: 1.0000 | ORAL_TABLET | Freq: Three times a day (TID) | ORAL | 0 refills | Status: DC | PRN

## 2019-06-18 NOTE — Patient Instructions (Signed)
Incision Care, Adult An incision is a surgical cut that is made through your skin. Most incisions are closed after surgery. Your incision may be closed with stitches (sutures), staples, skin glue, or adhesive strips. You may need to return to your health care provider to have sutures or staples removed. This may occur several days to several weeks after your surgery. The incision needs to be cared for properly to prevent infection. How to care for your incision Incision care   Follow instructions from your health care provider about how to take care of your incision. Make sure you: ? Wash your hands with soap and water before you change the bandage (dressing). If soap and water are not available, use hand sanitizer. ? Change your dressing as told by your health care provider. ? Leave sutures, skin glue, or adhesive strips in place. These skin closures may need to stay in place for 2 weeks or longer. If adhesive strip edges start to loosen and curl up, you may trim the loose edges. Do not remove adhesive strips completely unless your health care provider tells you to do that.  Check your incision area every day for signs of infection. Check for: ? More redness, swelling, or pain. ? More fluid or blood. ? Warmth. ? Pus or a bad smell.  Ask your health care provider how to clean the incision. This may include: ? Using mild soap and water. ? Using a clean towel to pat the incision dry after cleaning it. ? Applying a cream or ointment. Do this only as told by your health care provider. ? Covering the incision with a clean dressing.  Ask your health care provider when you can leave the incision uncovered.  Do not take baths, swim, or use a hot tub until your health care provider approves. Ask your health care provider if you can take showers. You may only be allowed to take sponge baths for bathing. Medicines  If you were prescribed an antibiotic medicine, cream, or ointment, take or apply the  antibiotic as told by your health care provider. Do not stop taking or applying the antibiotic even if your condition improves.  Take over-the-counter and prescription medicines only as told by your health care provider. General instructions  Limit movement around your incision to improve healing. ? Avoid straining, lifting, or exercise for the first month, or for as long as told by your health care provider. ? Follow instructions from your health care provider about returning to your normal activities. ? Ask your health care provider what activities are safe.  Protect your incision from the sun when you are outside for the first 6 months, or for as long as told by your health care provider. Apply sunscreen around the scar or cover it up.  Keep all follow-up visits as told by your health care provider. This is important. Contact a health care provider if:  Your have more redness, swelling, or pain around the incision.  You have more fluid or blood coming from the incision.  Your incision feels warm to the touch.  You have pus or a bad smell coming from the incision.  You have a fever or shaking chills.  You are nauseous or you vomit.  You are dizzy.  Your sutures or staples come undone. Get help right away if:  You have a red streak coming from your incision.  Your incision bleeds through the dressing and the bleeding does not stop with gentle pressure.  The edges of   your incision open up and separate.  You have severe pain.  You have a rash.  You are confused.  You faint.  You have trouble breathing and a fast heartbeat. This information is not intended to replace advice given to you by your health care provider. Make sure you discuss any questions you have with your health care provider. Document Revised: 05/19/2018 Document Reviewed: 12/03/2015 Elsevier Patient Education  2020 Elsevier Inc.   

## 2019-06-18 NOTE — Assessment & Plan Note (Signed)
Surgical excision, primary closure. Hydrocodone for postoperative pain. Return to see me in 10 days for suture removal.

## 2019-06-18 NOTE — Progress Notes (Signed)
   Procedure:  Excision of 2.1 cm upper back subcutaneous tumor Risks, benefits, and alternatives explained and consent obtained. Time out conducted. Surface prepped with alcohol. 10cc lidocaine with epinephine infiltrated in a field block. Adequate anesthesia ensured. Area prepped and draped in a sterile fashion. Excision performed with: Using a #10 blade I made a linear incision, then using both sharp and blunt dissection I carried the procedure through the skin and into the subcutaneous tissues, I removed a 2.1 cm subcutaneous tumor that appeared to be a sebaceous cyst with its capsule, I then closed the incision after inspecting the wound with 5, 3-0 simple interrupted Ethilon sutures. Hemostasis achieved. Pt stable.

## 2019-06-28 ENCOUNTER — Other Ambulatory Visit: Payer: Self-pay

## 2019-06-28 ENCOUNTER — Ambulatory Visit (INDEPENDENT_AMBULATORY_CARE_PROVIDER_SITE_OTHER): Payer: No Typology Code available for payment source | Admitting: Sports Medicine

## 2019-06-28 DIAGNOSIS — L723 Sebaceous cyst: Secondary | ICD-10-CM

## 2019-06-28 NOTE — Progress Notes (Signed)
   Impression and Recommendations:    I have performed independent interpretation of the relevant labs and imaging ordered by this patient's other providers.  Sebaceous cyst of mid back Scott Herring returns, he is 10 days post surgical excision of sebaceous cyst from his back, doing extremely well, incision looks good, sutures removed today, return as needed.    ___________________________________________ Ihor Austin. Benjamin Stain, M.D., ABFM., CAQSM. Primary Care and Sports Medicine [facility]  Adjunct Professor of Family Medicine  [employer]

## 2019-06-28 NOTE — Assessment & Plan Note (Signed)
Grae returns, he is 10 days post surgical excision of sebaceous cyst from his back, doing extremely well, incision looks good, sutures removed today, return as needed.

## 2020-04-23 ENCOUNTER — Ambulatory Visit (INDEPENDENT_AMBULATORY_CARE_PROVIDER_SITE_OTHER): Payer: 59 | Admitting: Sports Medicine

## 2020-04-23 ENCOUNTER — Encounter: Payer: Self-pay | Admitting: Sports Medicine

## 2020-04-23 ENCOUNTER — Other Ambulatory Visit: Payer: Self-pay

## 2020-04-23 VITALS — BP 125/67 | HR 70 | Ht 71.0 in | Wt 214.0 lb

## 2020-04-23 DIAGNOSIS — Z0000 Encounter for general adult medical examination without abnormal findings: Secondary | ICD-10-CM | POA: Diagnosis not present

## 2020-04-23 DIAGNOSIS — R7401 Elevation of levels of liver transaminase levels: Secondary | ICD-10-CM

## 2020-04-23 DIAGNOSIS — E781 Pure hyperglyceridemia: Secondary | ICD-10-CM

## 2020-04-23 DIAGNOSIS — E559 Vitamin D deficiency, unspecified: Secondary | ICD-10-CM | POA: Diagnosis not present

## 2020-04-23 NOTE — Progress Notes (Addendum)
Subjective:    CC: Annual Physical Exam  HPI:  This patient is here for their annual physical  I reviewed the past medical history, family history, social history, surgical history, and allergies today and no changes were needed.  Please see the problem list section below in epic for further details.  Past Medical History: No past medical history on file. Past Surgical History: No past surgical history on file. Social History: Social History   Socioeconomic History  . Marital status: Unknown    Spouse name: Not on file  . Number of children: Not on file  . Years of education: Not on file  . Highest education level: Not on file  Occupational History  . Not on file  Tobacco Use  . Smoking status: Smoker, Current Status Unknown  . Smokeless tobacco: Current User    Types: Chew  Substance and Sexual Activity  . Alcohol use: Yes  . Drug use: No  . Sexual activity: Not on file  Other Topics Concern  . Not on file  Social History Narrative  . Not on file   Social Determinants of Health   Financial Resource Strain:   . Difficulty of Paying Living Expenses: Not on file  Food Insecurity:   . Worried About Programme researcher, broadcasting/film/video in the Last Year: Not on file  . Ran Out of Food in the Last Year: Not on file  Transportation Needs:   . Lack of Transportation (Medical): Not on file  . Lack of Transportation (Non-Medical): Not on file  Physical Activity:   . Days of Exercise per Week: Not on file  . Minutes of Exercise per Session: Not on file  Stress:   . Feeling of Stress : Not on file  Social Connections:   . Frequency of Communication with Friends and Family: Not on file  . Frequency of Social Gatherings with Friends and Family: Not on file  . Attends Religious Services: Not on file  . Active Member of Clubs or Organizations: Not on file  . Attends Banker Meetings: Not on file  . Marital Status: Not on file   Family History: No family history on  file. Allergies: No Known Allergies Medications: See med rec.  Review of Systems: No headache, visual changes, nausea, vomiting, diarrhea, constipation, dizziness, abdominal pain, skin rash, fevers, chills, night sweats, swollen lymph nodes, weight loss, chest pain, body aches, joint swelling, muscle aches, shortness of breath, mood changes, visual or auditory hallucinations.  Objective:    General: Well Developed, well nourished, and in no acute distress.  Neuro: Alert and oriented x3, extra-ocular muscles intact, sensation grossly intact. Cranial nerves II through XII are intact, motor, sensory, and coordinative functions are all intact. HEENT: Normocephalic, atraumatic, pupils equal round reactive to light, neck supple, no masses, no lymphadenopathy, thyroid nonpalpable. Oropharynx, nasopharynx, external ear canals are unremarkable. Skin: Warm and dry, no rashes noted.  Cardiac: Regular rate and rhythm, no murmurs rubs or gallops.  Respiratory: Clear to auscultation bilaterally. Not using accessory muscles, speaking in full sentences.  Abdominal: Soft, nontender, nondistended, positive bowel sounds, no masses, no organomegaly.  Musculoskeletal: Shoulder, elbow, wrist, hip, knee, ankle stable, and with full range of motion.  Impression and Recommendations:    The patient was counselled, risk factors were discussed, anticipatory guidance given.  Annual physical exam Annual physical exam as above. Checking routine labs. Up-to-date on screening measures, declines influenza and COVID-19 vaccinations. Return to see me in a year.  Hypertriglyceridemia Elevated triglycerides  and low vitamin D at the last sample but he was nonfasting, repeating labs, he will come back fasting.   Transaminitis secondary to alcohol intake Noted transaminitis, and you he is consuming approximately an average of 9 beers 4 nights per week equaling 36 beers per week. I have recommended that he dropped to no  more than 2 to 3/day equaling 14-21 at the most per week. Holding off on recommending complete cessation to reduce the chances of withdrawal/DTs.   ___________________________________________ Ihor Austin. Benjamin Stain, M.D., ABFM., CAQSM. Primary Care and Sports Medicine [facility]  Adjunct Professor of Family Medicine  [employer]

## 2020-04-23 NOTE — Assessment & Plan Note (Signed)
Annual physical exam as above. Checking routine labs. Up-to-date on screening measures, declines influenza and COVID-19 vaccinations. Return to see me in a year.

## 2020-04-23 NOTE — Assessment & Plan Note (Signed)
Elevated triglycerides and low vitamin D at the last sample but he was nonfasting, repeating labs, he will come back fasting.

## 2020-04-30 DIAGNOSIS — R7401 Elevation of levels of liver transaminase levels: Secondary | ICD-10-CM | POA: Insufficient documentation

## 2020-04-30 LAB — LIPID PANEL
Cholesterol: 205 mg/dL — ABNORMAL HIGH (ref <200)
HDL: 43 mg/dL (ref >=40)
LDL Cholesterol (Calc): 129 mg/dL (calc) — ABNORMAL HIGH
Non-HDL Cholesterol (Calc): 162 mg/dL (calc) — ABNORMAL HIGH (ref <130)
Total CHOL/HDL Ratio: 4.8 (calc) (ref <5.0)
Triglycerides: 193 mg/dL — ABNORMAL HIGH (ref <150)

## 2020-04-30 LAB — CBC
HCT: 47.5 % (ref 38.5–50.0)
Hemoglobin: 16.5 g/dL (ref 13.2–17.1)
MCH: 32.4 pg (ref 27.0–33.0)
MCHC: 34.7 g/dL (ref 32.0–36.0)
MCV: 93.3 fL (ref 80.0–100.0)
MPV: 9.1 fL (ref 7.5–12.5)
Platelets: 285 10*3/uL (ref 140–400)
RBC: 5.09 10*6/uL (ref 4.20–5.80)
RDW: 11.7 % (ref 11.0–15.0)
WBC: 7.4 10*3/uL (ref 3.8–10.8)

## 2020-04-30 LAB — COMPREHENSIVE METABOLIC PANEL
AG Ratio: 1.5 (calc) (ref 1.0–2.5)
ALT: 60 U/L — ABNORMAL HIGH (ref 9–46)
AST: 47 U/L — ABNORMAL HIGH (ref 10–40)
Albumin: 4.7 g/dL (ref 3.6–5.1)
Alkaline phosphatase (APISO): 65 U/L (ref 36–130)
BUN: 9 mg/dL (ref 7–25)
CO2: 28 mmol/L (ref 20–32)
Calcium: 9.8 mg/dL (ref 8.6–10.3)
Chloride: 101 mmol/L (ref 98–110)
Creat: 1.14 mg/dL (ref 0.60–1.35)
Globulin: 3.2 g/dL (calc) (ref 1.9–3.7)
Glucose, Bld: 76 mg/dL (ref 65–99)
Potassium: 4.6 mmol/L (ref 3.5–5.3)
Sodium: 138 mmol/L (ref 135–146)
Total Bilirubin: 1.1 mg/dL (ref 0.2–1.2)
Total Protein: 7.9 g/dL (ref 6.1–8.1)

## 2020-04-30 LAB — VITAMIN D 25 HYDROXY (VIT D DEFICIENCY, FRACTURES): Vit D, 25-Hydroxy: 35 ng/mL (ref 30–100)

## 2020-04-30 LAB — TSH: TSH: 1.02 mIU/L (ref 0.40–4.50)

## 2020-04-30 NOTE — Assessment & Plan Note (Signed)
Noted transaminitis, and you he is consuming approximately an average of 9 beers 4 nights per week equaling 36 beers per week. I have recommended that he dropped to no more than 2 to 3/day equaling 14-21 at the most per week. Holding off on recommending complete cessation to reduce the chances of withdrawal/DTs.

## 2020-12-08 ENCOUNTER — Encounter: Payer: Self-pay | Admitting: Family Medicine

## 2020-12-08 ENCOUNTER — Other Ambulatory Visit: Payer: Self-pay

## 2020-12-08 ENCOUNTER — Ambulatory Visit (INDEPENDENT_AMBULATORY_CARE_PROVIDER_SITE_OTHER): Payer: 59 | Admitting: Family Medicine

## 2020-12-08 VITALS — BP 132/79 | HR 68 | Resp 16 | Wt 203.0 lb

## 2020-12-08 DIAGNOSIS — L048 Acute lymphadenitis of other sites: Secondary | ICD-10-CM | POA: Diagnosis not present

## 2020-12-08 MED ORDER — DOXYCYCLINE HYCLATE 100 MG PO TABS: 100.0000 mg | ORAL_TABLET | Freq: Two times a day (BID) | ORAL | 0 refills | Status: AC

## 2020-12-08 NOTE — Progress Notes (Signed)
Acute Office Visit  Subjective:    Patient ID: Scott Herring, male    DOB: May 09, 1991, 30 y.o.   MRN: [id number]  Chief Complaint  Patient presents with   Groin Pain    HPI Patient is in today for bulge in groin, concerned about hernia.   Towards the end of last week, patient a noticed a bump/bulge to right groin. States it wasn't too bothersome at first, but has gradually become more painful. Describes the sensation as heaviness, pressure. Reports some mild constipation but no diarrhea, blood stool, rashes, wounds, fevers. Is not able to reduce the area. Discomfort can be 8/10 with pressure (bending, straining), but stays about 4-5/10 at rest. Does not notice much improvement with applying pressure/support to the area.     History reviewed. No pertinent past medical history.  History reviewed. No pertinent surgical history.  History reviewed. No pertinent family history.  Social History   Socioeconomic History   Marital status: Unknown    Spouse name: Not on file   Number of children: Not on file   Years of education: Not on file   Highest education level: Not on file  Occupational History   Not on file  Tobacco Use   Smoking status: Smoker, Current Status Unknown    Pack years: 0.00   Smokeless tobacco: Current    Types: Chew  Substance and Sexual Activity   Alcohol use: Yes   Drug use: No   Sexual activity: Not on file  Other Topics Concern   Not on file  Social History Narrative   Not on file   Social Determinants of Health   Financial Resource Strain: Not on file  Food Insecurity: Not on file  Transportation Needs: Not on file  Physical Activity: Not on file  Stress: Not on file  Social Connections: Not on file  Intimate Partner Violence: Not on file    Outpatient Medications Prior to Visit  Medication Sig Dispense Refill   cholecalciferol (VITAMIN D3) 25 MCG (1000 UNIT) tablet Take 3,000 Units by mouth daily.     clobetasol ointment (TEMOVATE) 0.05  % Apply 1 application topically 2 (two) times daily. 60 g 0   No facility-administered medications prior to visit.    No Known Allergies  Review of Systems All review of systems negative except what is listed in the HPI     Objective:    Physical Exam Vitals reviewed.  Constitutional:      Appearance: Normal appearance.  Abdominal:     Comments: Right groin with approximately 1 cm nodule, feels more like a lymph node, not bulging, not reducible, mild erythema, no fluctuance, another smaller node is palpable next to this one  Musculoskeletal:        General: Normal range of motion.  Skin:    Capillary Refill: Capillary refill takes less than 2 seconds.     Findings: No rash.  Neurological:     General: No focal deficit present.     Mental Status: He is alert and oriented to person, place, and time. Mental status is at baseline.  Psychiatric:        Mood and Affect: Mood normal.        Behavior: Behavior normal.        Thought Content: Thought content normal.    BP 132/79   Pulse 68   Resp 16   Wt 203 lb (92.1 kg)   SpO2 100%   BMI 28.31 kg/m  Wt Readings from Last  3 Encounters:  12/08/20 203 lb (92.1 kg)  04/23/20 214 lb (97.1 kg)  02/19/19 205 lb (93 kg)    Health Maintenance Due  Topic Date Due   COVID-19 Vaccine (1) Never done   Pneumococcal Vaccine 17-61 Years old (1 - PCV) Never done   Hepatitis C Screening  Never done    There are no preventive care reminders to display for this patient.   Lab Results  Component Value Date   TSH 1.02 04/29/2020   Lab Results  Component Value Date   WBC 7.4 04/29/2020   HGB 16.5 04/29/2020   HCT 47.5 04/29/2020   MCV 93.3 04/29/2020   PLT 285 04/29/2020   Lab Results  Component Value Date   NA 138 04/29/2020   K 4.6 04/29/2020   CO2 28 04/29/2020   GLUCOSE 76 04/29/2020   BUN 9 04/29/2020   CREATININE 1.14 04/29/2020   BILITOT 1.1 04/29/2020   AST 47 (H) 04/29/2020   ALT 60 (H) 04/29/2020   PROT 7.9  04/29/2020   CALCIUM 9.8 04/29/2020   Lab Results  Component Value Date   CHOL 205 (H) 04/29/2020   Lab Results  Component Value Date   HDL 43 04/29/2020   Lab Results  Component Value Date   LDLCALC 129 (H) 04/29/2020   Lab Results  Component Value Date   TRIG 193 (H) 04/29/2020   Lab Results  Component Value Date   CHOLHDL 4.8 04/29/2020   Lab Results  Component Value Date   HGBA1C 4.8 02/19/2019       Assessment & Plan:   1. Acute inguinal lymphadenitis Presentation more consistent with lymphadenitis. No clear source of infection to determine viral vs bacterial. Will go ahead and treat prophylactically with doxy and encourage warm compresses and gentle massage to improve circulation. Patient aware of signs/symptoms requiring further/urgent evaluation.   - doxycycline (VIBRA-TABS) 100 MG tablet; Take 1 tablet (100 mg total) by mouth 2 (two) times daily for 7 days.  Dispense: 14 tablet; Refill: 0   Follow-up in 2 weeks to reevaluate or sooner if needed.    Lollie Marrow Reola Calkins, DNP, FNP-C

## 2020-12-08 NOTE — Patient Instructions (Signed)
Warm compresses and massage to area. Antibiotics

## 2020-12-22 ENCOUNTER — Ambulatory Visit (INDEPENDENT_AMBULATORY_CARE_PROVIDER_SITE_OTHER): Payer: 59 | Admitting: Sports Medicine

## 2020-12-22 ENCOUNTER — Other Ambulatory Visit: Payer: Self-pay

## 2020-12-22 ENCOUNTER — Encounter: Payer: Self-pay | Admitting: Sports Medicine

## 2020-12-22 DIAGNOSIS — R590 Localized enlarged lymph nodes: Secondary | ICD-10-CM | POA: Diagnosis not present

## 2020-12-22 NOTE — Progress Notes (Signed)
    Procedures performed today:    None.  Independent interpretation of notes and tests performed by another provider:   None.  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 No distal leg symptoms, no testicular symptoms, no penile rashes, discharge. He saw 1 my partners, she started doxycycline, he returns today doing a lot better, 2 weeks later. The lymph node is still present, it feels to be about 1 and half centimeters across, well-defined, rubbery, and nontender. He denies any constitutional type symptoms. I think we can simply watch this for another month before considering imaging/biopsy.    ___________________________________________ Ihor Austin. Benjamin Stain, M.D., ABFM., CAQSM. Primary Care and Sports Medicine [facility]  Adjunct Instructor of Family Medicine  [employer]

## 2020-12-22 NOTE — Assessment & Plan Note (Addendum)
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 No distal leg symptoms, no testicular symptoms, no penile rashes, discharge. He saw 1 my partners, she started doxycycline, he returns today doing a lot better, 2 weeks later. The lymph node is still present, it feels to be about 1 and half centimeters across, well-defined, rubbery, and nontender. He denies any constitutional type symptoms. I think we can simply watch this for another month before considering imaging/biopsy.

## 2021-04-03 ENCOUNTER — Ambulatory Visit (INDEPENDENT_AMBULATORY_CARE_PROVIDER_SITE_OTHER): Payer: 59 | Admitting: Sports Medicine

## 2021-04-03 VITALS — BP 129/78 | HR 56 | Wt 206.0 lb

## 2021-04-03 DIAGNOSIS — E538 Deficiency of other specified B group vitamins: Secondary | ICD-10-CM | POA: Diagnosis not present

## 2021-04-03 DIAGNOSIS — E559 Vitamin D deficiency, unspecified: Secondary | ICD-10-CM | POA: Diagnosis not present

## 2021-04-03 DIAGNOSIS — E781 Pure hyperglyceridemia: Secondary | ICD-10-CM | POA: Diagnosis not present

## 2021-04-03 DIAGNOSIS — Z0000 Encounter for general adult medical examination without abnormal findings: Secondary | ICD-10-CM | POA: Diagnosis not present

## 2021-04-03 NOTE — Assessment & Plan Note (Signed)
Scott Herring returns, he is a pleasant 30 year old male, he is up-to-date on screening measures, declines flu and COVID, adding some routine labs today. He is very tired, his job is having him travel all around the country, he does not really get to spend much time at home, spends most of the week in hotel rooms and airports, he will try to get to more of a normal schedule with his job, and will let me know if there is anything I can do to help. I did fill out his physical exam form today.

## 2021-04-03 NOTE — Progress Notes (Signed)
  Subjective:    CC: Annual Physical Exam  HPI:  This patient is here for their annual physical  I reviewed the past medical history, family history, social history, surgical history, and allergies today and no changes were needed.  Please see the problem list section below in epic for further details.  Past Medical History: No past medical history on file. Past Surgical History: No past surgical history on file. Social History: Social History   Socioeconomic History   Marital status: Unknown    Spouse name: Not on file   Number of children: Not on file   Years of education: Not on file   Highest education level: Not on file  Occupational History   Not on file  Tobacco Use   Smoking status: Smoker, Current Status Unknown   Smokeless tobacco: Current    Types: Chew  Substance and Sexual Activity   Alcohol use: Yes   Drug use: No   Sexual activity: Not on file  Other Topics Concern   Not on file  Social History Narrative   Not on file   Social Determinants of Health   Financial Resource Strain: Not on file  Food Insecurity: Not on file  Transportation Needs: Not on file  Physical Activity: Not on file  Stress: Not on file  Social Connections: Not on file   Family History: No family history on file. Allergies: No Known Allergies Medications: See med rec.  Review of Systems: No headache, visual changes, nausea, vomiting, diarrhea, constipation, dizziness, abdominal pain, skin rash, fevers, chills, night sweats, swollen lymph nodes, weight loss, chest pain, body aches, joint swelling, muscle aches, shortness of breath, mood changes, visual or auditory hallucinations.  Objective:    General: Well Developed, well nourished, and in no acute distress.  Neuro: Alert and oriented x3, extra-ocular muscles intact, sensation grossly intact. Cranial nerves II through XII are intact, motor, sensory, and coordinative functions are all intact. HEENT: Normocephalic, atraumatic,  pupils equal round reactive to light, neck supple, no masses, no lymphadenopathy, thyroid nonpalpable. Oropharynx, nasopharynx, external ear canals are unremarkable. Skin: Warm and dry, no rashes noted.  Cardiac: Regular rate and rhythm, no murmurs rubs or gallops.  Respiratory: Clear to auscultation bilaterally. Not using accessory muscles, speaking in full sentences.  Abdominal: Soft, nontender, nondistended, positive bowel sounds, no masses, no organomegaly.  Musculoskeletal: Shoulder, elbow, wrist, hip, knee, ankle stable, and with full range of motion.  Impression and Recommendations:    The patient was counselled, risk factors were discussed, anticipatory guidance given.  Annual physical exam Syair returns, he is a pleasant 30 year old male, he is up-to-date on screening measures, declines flu and COVID, adding some routine labs today. He is very tired, his job is having him travel all around the country, he does not really get to spend much time at home, spends most of the week in hotel rooms and airports, he will try to get to more of a normal schedule with his job, and will let me know if there is anything I can do to help. I did fill out his physical exam form today.   ___________________________________________ Ihor Austin. Benjamin Stain, M.D., ABFM., CAQSM. Primary Care and Sports Medicine [facility]  Adjunct Professor of Family Medicine  [employer]

## 2021-04-04 LAB — CBC
HCT: 47.4 % (ref 38.5–50.0)
Hemoglobin: 16.2 g/dL (ref 13.2–17.1)
MCH: 32.1 pg (ref 27.0–33.0)
MCHC: 34.2 g/dL (ref 32.0–36.0)
MCV: 93.9 fL (ref 80.0–100.0)
MPV: 9.5 fL (ref 7.5–12.5)
Platelets: 273 10*3/uL (ref 140–400)
RBC: 5.05 10*6/uL (ref 4.20–5.80)
RDW: 12.1 % (ref 11.0–15.0)
WBC: 7.1 10*3/uL (ref 3.8–10.8)

## 2021-04-04 LAB — COMPREHENSIVE METABOLIC PANEL
AG Ratio: 1.4 (calc) (ref 1.0–2.5)
ALT: 42 U/L (ref 9–46)
AST: 30 U/L (ref 10–40)
Albumin: 4.6 g/dL (ref 3.6–5.1)
Alkaline phosphatase (APISO): 65 U/L (ref 36–130)
BUN: 11 mg/dL (ref 7–25)
CO2: 29 mmol/L (ref 20–32)
Calcium: 9.9 mg/dL (ref 8.6–10.3)
Chloride: 101 mmol/L (ref 98–110)
Creat: 1.09 mg/dL (ref 0.60–1.26)
Globulin: 3.3 g/dL (calc) (ref 1.9–3.7)
Glucose, Bld: 92 mg/dL (ref 65–99)
Potassium: 4.6 mmol/L (ref 3.5–5.3)
Sodium: 137 mmol/L (ref 135–146)
Total Bilirubin: 0.6 mg/dL (ref 0.2–1.2)
Total Protein: 7.9 g/dL (ref 6.1–8.1)

## 2021-04-04 LAB — LIPID PANEL
Cholesterol: 192 mg/dL (ref <200)
HDL: 41 mg/dL (ref >=40)
LDL Cholesterol (Calc): 112 mg/dL (calc) — ABNORMAL HIGH
Non-HDL Cholesterol (Calc): 151 mg/dL (calc) — ABNORMAL HIGH (ref <130)
Total CHOL/HDL Ratio: 4.7 (calc) (ref <5.0)
Triglycerides: 276 mg/dL — ABNORMAL HIGH (ref <150)

## 2021-04-04 LAB — VITAMIN D 25 HYDROXY (VIT D DEFICIENCY, FRACTURES): Vit D, 25-Hydroxy: 34 ng/mL (ref 30–100)

## 2021-04-04 LAB — VITAMIN B12: Vitamin B-12: 1107 pg/mL — ABNORMAL HIGH (ref 200–1100)

## 2021-04-04 LAB — TSH: TSH: 1.26 mIU/L (ref 0.40–4.50)

## 2021-07-06 ENCOUNTER — Other Ambulatory Visit: Payer: Self-pay

## 2021-07-06 ENCOUNTER — Ambulatory Visit
Admission: EM | Admit: 2021-07-06 | Discharge: 2021-07-06 | Disposition: A | Discharge status: Home / Self Care | Payer: 59

## 2021-07-06 DIAGNOSIS — H00012 Hordeolum externum right lower eyelid: Secondary | ICD-10-CM

## 2021-07-06 MED ORDER — ERYTHROMYCIN 5 MG/GM OP OINT: TOPICAL_OINTMENT | OPHTHALMIC | 0 refills | Status: DC

## 2021-07-06 NOTE — ED Provider Notes (Signed)
[facility]    CSN: [id number] Arrival date & time: 07/06/21  1359      History   Chief Complaint Chief Complaint  Patient presents with   Eye Problem    HPI Scott Herring is a 31 y.o. male.   Presenting today with 2 to 3-day history of worsening red bump, pain, swelling to the lower right eyelid.  Denies any injury to the area, fever, chills, drainage, vision changes, headache.  So far tried warm compresses with no relief.   History reviewed. No pertinent past medical history.  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 No pertinent surgical history.     Home Medications    Prior to Admission medications   Medication Sig Start Date End Date Taking? Authorizing Provider  erythromycin ophthalmic ointment Place a 1/2 inch ribbon of ointment into the right lower eyelid BID. 07/06/21  Yes Volney American, PA-C  Multiple Vitamin (MULTIVITAMIN) tablet Take 1 tablet by mouth daily.   Yes [provider]    Family History History reviewed. No pertinent family history.  Social History Social History   Tobacco Use   Smoking status: Smoker, Current Status Unknown   Smokeless tobacco: Current    Types: Chew  Substance Use Topics   Alcohol use: Yes   Drug use: No     Allergies   Patient has no known allergies.   Review of Systems Review of Systems Per HPI  Physical Exam Triage Vital Signs ED Triage Vitals  Enc Vitals Group     BP 07/06/21 1458 133/74     Pulse Rate 07/06/21 1458 60     Resp 07/06/21 1458 16     Temp 07/06/21 1458 98.4 F (36.9  C)     Temp Source 07/06/21 1458 Oral     SpO2 07/06/21 1458 99 %     Weight --      Height --      Head Circumference --      Peak Flow --      Pain Score 07/06/21 1457 4     Pain Loc --      Pain Edu? --      Excl. in [place]? --    No data found.  Updated Vital Signs BP 133/74 (BP Location: Right Arm)    Pulse 60    Temp 98.4 F (36.9 C) (Oral)    Resp 16    SpO2 99%   Visual Acuity Right Eye Distance: 20/25 (Without correction.) Left Eye Distance: 20/100 (Without correction.) Bilateral Distance: 20/25 (Without correction.)  Right Eye Near:   Left Eye Near:    Bilateral Near:     Physical Exam Vitals and nursing note reviewed.  Constitutional:      Appearance: Normal appearance.  HENT:     Head: Atraumatic.  Eyes:     Extraocular Movements: Extraocular movements intact.     Conjunctiva/sclera: Conjunctivae normal.     Comments: Stye present central right lower eyelid  Cardiovascular:     Rate and Rhythm: Normal rate and regular rhythm.  Pulmonary:     Effort: Pulmonary effort is  normal.     Breath sounds: Normal breath sounds.  Musculoskeletal:        General: Normal range of motion.     Cervical back: Normal range of motion and neck supple.  Skin:    General: Skin is warm and dry.  Neurological:     General: No focal deficit present.     Mental Status: He is oriented to person, place, and time.  Psychiatric:        Mood and Affect: Mood normal.        Thought Content: Thought content normal.        Judgment: Judgment normal.     UC Treatments / Results  Labs (all labs ordered are listed, but only abnormal results are displayed) Labs Reviewed - No data to display  EKG   Radiology No results found.  Procedures Procedures (including critical care time)  Medications Ordered in UC Medications - No data to display  Initial Impression / Assessment and Plan / UC Course  I have reviewed the triage vital signs and the nursing notes.  Pertinent labs &  imaging results that were available during my care of the patient were reviewed by me and considered in my medical decision making (see chart for details).     Vital signs benign and reassuring, stye present on right lower eyelid.  Will treat with erythromycin ointment, warm compresses.  Follow-up if worsening or not resolving.  Final Clinical Impressions(s) / UC Diagnoses   Final diagnoses:  Hordeolum externum of right lower eyelid   Discharge Instructions   None    ED Prescriptions     Medication Sig Dispense Auth. Provider   erythromycin ophthalmic ointment Place a 1/2 inch ribbon of ointment into the right lower eyelid BID. 3.5 g Volney American, PA-C      PDMP not reviewed this encounter.   Volney American, [place] 07/06/21 [phone]

## 2021-07-06 NOTE — ED Triage Notes (Signed)
Pt reports having an stye in the right lower eyelid x 4 days.

## 2022-08-12 ENCOUNTER — Encounter: Payer: 59 | Admitting: Sports Medicine

## 2022-08-17 ENCOUNTER — Encounter: Payer: Self-pay | Admitting: Sports Medicine

## 2022-08-17 ENCOUNTER — Ambulatory Visit (INDEPENDENT_AMBULATORY_CARE_PROVIDER_SITE_OTHER): Payer: Managed Care, Other (non HMO) | Admitting: Sports Medicine

## 2022-08-17 ENCOUNTER — Ambulatory Visit: Payer: Managed Care, Other (non HMO)

## 2022-08-17 VITALS — BP 113/75 | HR 63 | Wt 213.0 lb

## 2022-08-17 DIAGNOSIS — E781 Pure hyperglyceridemia: Secondary | ICD-10-CM | POA: Diagnosis not present

## 2022-08-17 DIAGNOSIS — R142 Eructation: Secondary | ICD-10-CM

## 2022-08-17 DIAGNOSIS — Z0000 Encounter for general adult medical examination without abnormal findings: Secondary | ICD-10-CM | POA: Diagnosis not present

## 2022-08-17 DIAGNOSIS — R0789 Other chest pain: Secondary | ICD-10-CM | POA: Insufficient documentation

## 2022-08-17 MED ORDER — PANTOPRAZOLE SODIUM 40 MG PO TBEC: 40.0000 mg | DELAYED_RELEASE_TABLET | Freq: Every day | ORAL | 3 refills | Status: DC

## 2022-08-17 NOTE — Assessment & Plan Note (Signed)
Excessive eructations, epigastric fullness. No hematemesis, melena, hematochezia, no weight loss. Suspect acid reflux/gastritis. We will do H. pylori testing, I would like an abdominal x-ray looking for signs of a hiatal hernia. Adding pantoprazole and referral to gastroenterology. We also discussed minimizing alcohol intake as well as food intake within 2 to 3 hours of bedtime.

## 2022-08-17 NOTE — Assessment & Plan Note (Signed)
Annual physical as above. Adding routine labs. Return to see me in 1 year.

## 2022-08-17 NOTE — Progress Notes (Addendum)
  Subjective:    CC: Annual Physical Exam  HPI:  This patient is here for their annual physical  I reviewed the past medical history, family history, social history, surgical history, and allergies today and no changes were needed.  Please see the problem list section below in epic for further details.  Past Medical History: No past medical history on file. Past Surgical History: No past surgical history on file. Social History: Social History   Socioeconomic History   Marital status: Unknown    Spouse name: Not on file   Number of children: Not on file   Years of education: Not on file   Highest education level: Not on file  Occupational History   Not on file  Tobacco Use   Smoking status: Smoker, Current Status Unknown   Smokeless tobacco: Current    Types: Chew  Substance and Sexual Activity   Alcohol use: Yes   Drug use: No   Sexual activity: Yes  Other Topics Concern   Not on file  Social History Narrative   Not on file   Social Determinants of Health   Financial Resource Strain: Not on file  Food Insecurity: Not on file  Transportation Needs: Not on file  Physical Activity: Not on file  Stress: Not on file  Social Connections: Not on file   Family History: No family history on file. Allergies: No Known Allergies Medications: See med rec.  Review of Systems: No headache, visual changes, nausea, vomiting, diarrhea, constipation, dizziness, abdominal pain, skin rash, fevers, chills, night sweats, swollen lymph nodes, weight loss, chest pain, body aches, joint swelling, muscle aches, shortness of breath, mood changes, visual or auditory hallucinations.  Objective:    General: Well Developed, well nourished, and in no acute distress.  Neuro: Alert and oriented x3, extra-ocular muscles intact, sensation grossly intact. Cranial nerves II through XII are intact, motor, sensory, and coordinative functions are all intact. HEENT: Normocephalic, atraumatic, pupils  equal round reactive to light, neck supple, no masses, no lymphadenopathy, thyroid nonpalpable. Oropharynx, nasopharynx, external ear canals are unremarkable. Skin: Warm and dry, no rashes noted.  Cardiac: Regular rate and rhythm, no murmurs rubs or gallops.  Respiratory: Clear to auscultation bilaterally. Not using accessory muscles, speaking in full sentences.  Abdominal: Soft, nontender, nondistended, positive bowel sounds, no masses, no organomegaly.  Musculoskeletal: Shoulder, elbow, wrist, hip, knee, ankle stable, and with full range of motion.  Impression and Recommendations:    The patient was counselled, risk factors were discussed, anticipatory guidance given.  Annual physical exam Annual physical as above. Adding routine labs. Return to see me in 1 year.  Eructation Excessive eructations, epigastric fullness. No hematemesis, melena, hematochezia, no weight loss. Suspect acid reflux/gastritis. We will do H. pylori testing, I would like an abdominal x-ray looking for signs of a hiatal hernia. Adding pantoprazole and referral to gastroenterology. We also discussed minimizing alcohol intake as well as food intake within 2 to 3 hours of bedtime.  Hypertriglyceridemia Lipids are quite elevated, Husani would like to do a low cholesterol diet for 3 months and then we can recheck fasting lipids.   ____________________________________________ Gwen Her. Dianah Field, M.D., ABFM., CAQSM., AME. Primary Care and Sports Medicine [facility]  Adjunct Professor of [employer] [place] [employer] [facility] [employer] of Medicine  Risk manager

## 2022-08-19 LAB — COMPREHENSIVE METABOLIC PANEL
AG Ratio: 1.4 (calc) (ref 1.0–2.5)
ALT: 57 U/L — ABNORMAL HIGH (ref 9–46)
AST: 40 U/L (ref 10–40)
Albumin: 4.9 g/dL (ref 3.6–5.1)
Alkaline phosphatase (APISO): 57 U/L (ref 36–130)
BUN: 18 mg/dL (ref 7–25)
CO2: 29 mmol/L (ref 20–32)
Calcium: 10.1 mg/dL (ref 8.6–10.3)
Chloride: 100 mmol/L (ref 98–110)
Creat: 1.02 mg/dL (ref 0.60–1.26)
Globulin: 3.5 g/dL (calc) (ref 1.9–3.7)
Glucose, Bld: 80 mg/dL (ref 65–99)
Potassium: 4.9 mmol/L (ref 3.5–5.3)
Sodium: 137 mmol/L (ref 135–146)
Total Bilirubin: 1 mg/dL (ref 0.2–1.2)
Total Protein: 8.4 g/dL — ABNORMAL HIGH (ref 6.1–8.1)

## 2022-08-19 LAB — LIPID PANEL
Cholesterol: 237 mg/dL — ABNORMAL HIGH (ref <200)
HDL: 41 mg/dL (ref >=40)
LDL Cholesterol (Calc): 135 mg/dL (calc) — ABNORMAL HIGH
Non-HDL Cholesterol (Calc): 196 mg/dL (calc) — ABNORMAL HIGH (ref <130)
Total CHOL/HDL Ratio: 5.8 (calc) — ABNORMAL HIGH (ref <5.0)
Triglycerides: 394 mg/dL — ABNORMAL HIGH (ref <150)

## 2022-08-19 LAB — CBC
HCT: 49.2 % (ref 38.5–50.0)
Hemoglobin: 17.1 g/dL (ref 13.2–17.1)
MCH: 32.1 pg (ref 27.0–33.0)
MCHC: 34.8 g/dL (ref 32.0–36.0)
MCV: 92.5 fL (ref 80.0–100.0)
MPV: 9.6 fL (ref 7.5–12.5)
Platelets: 299 10*3/uL (ref 140–400)
RBC: 5.32 10*6/uL (ref 4.20–5.80)
RDW: 12 % (ref 11.0–15.0)
WBC: 9 10*3/uL (ref 3.8–10.8)

## 2022-08-19 LAB — HEMOGLOBIN A1C
Hgb A1c MFr Bld: 5.2 % of total Hgb (ref <5.7)
Mean Plasma Glucose: 103 mg/dL
eAG (mmol/L): 5.7 mmol/L

## 2022-08-19 LAB — H. PYLORI BREATH TEST: H. pylori Breath Test: NOT DETECTED

## 2022-08-19 LAB — TSH: TSH: 1.05 mIU/L (ref 0.40–4.50)

## 2022-08-20 NOTE — Assessment & Plan Note (Signed)
Lipids are quite elevated, Binyamin would like to do a low cholesterol diet for 3 months and then we can recheck fasting lipids.

## 2022-09-28 ENCOUNTER — Ambulatory Visit (INDEPENDENT_AMBULATORY_CARE_PROVIDER_SITE_OTHER): Payer: Managed Care, Other (non HMO) | Admitting: Sports Medicine

## 2022-09-28 DIAGNOSIS — E781 Pure hyperglyceridemia: Secondary | ICD-10-CM

## 2022-09-28 DIAGNOSIS — R142 Eructation: Secondary | ICD-10-CM | POA: Diagnosis not present

## 2022-09-28 NOTE — Assessment & Plan Note (Signed)
Starting the Mediterranean diet, he will do this for 3 months and we can recheck his fasting lipids afterwards. If still elevated we will consider Vascepa versus fenofibrate.

## 2022-09-28 NOTE — Patient Instructions (Signed)

## 2022-09-28 NOTE — Progress Notes (Signed)
    Procedures performed today:    None.  Independent interpretation of notes and tests performed by another provider:   None.  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 Suspected acid reflux/gastritis/dyspepsia. H. pylori testing was negative, we added pantoprazole, he also minimized alcohol intake as well as food intake within 2 to 3 hours of bedtime. X-rays were negative for any signs of hiatal hernia. I would like him to touch base with gastroenterology due to persistence of symptoms.  Hypertriglyceridemia Starting the Mediterranean diet, he will do this for 3 months and we can recheck his fasting lipids afterwards. If still elevated we will consider Vascepa versus fenofibrate.    ____________________________________________ Ihor [place]. Benjamin Stain, M.D., ABFM., CAQSM., AME. Primary Care and Sports Medicine [facility] [facility]  Adjunct Professor of Family Medicine  [place] [employer] [facility] [employer]  Restaurant manager, fast food

## 2022-09-28 NOTE — Assessment & Plan Note (Signed)
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 Suspected acid reflux/gastritis/dyspepsia. H. pylori testing was negative, we added pantoprazole, he also minimized alcohol intake as well as food intake within 2 to 3 hours of bedtime. X-rays were negative for any signs of hiatal hernia. I would like him to touch base with gastroenterology due to persistence of symptoms.

## 2023-02-08 ENCOUNTER — Other Ambulatory Visit: Payer: Self-pay

## 2023-02-08 DIAGNOSIS — E781 Pure hyperglyceridemia: Secondary | ICD-10-CM

## 2023-02-09 LAB — CMP14+EGFR
ALT: 61 IU/L — ABNORMAL HIGH (ref 0–44)
AST: 41 IU/L — ABNORMAL HIGH (ref 0–40)
Albumin: 4.7 g/dL (ref 4.1–5.1)
Alkaline Phosphatase: 77 IU/L (ref 44–121)
BUN/Creatinine Ratio: 11 (ref 9–20)
BUN: 12 mg/dL (ref 6–20)
Bilirubin Total: 1.4 mg/dL — ABNORMAL HIGH (ref 0.0–1.2)
CO2: 23 mmol/L (ref 20–29)
Calcium: 10.3 mg/dL — ABNORMAL HIGH (ref 8.7–10.2)
Chloride: 98 mmol/L (ref 96–106)
Creatinine, Ser: 1.09 mg/dL (ref 0.76–1.27)
Globulin, Total: 3.1 g/dL (ref 1.5–4.5)
Glucose: 103 mg/dL — ABNORMAL HIGH (ref 70–99)
Potassium: 4.8 mmol/L (ref 3.5–5.2)
Sodium: 138 mmol/L (ref 134–144)
Total Protein: 7.8 g/dL (ref 6.0–8.5)
eGFR: 91 mL/min/{1.73_m2} (ref >59)

## 2023-02-09 LAB — LIPID PANEL
Chol/HDL Ratio: 6.7 ratio — ABNORMAL HIGH (ref 0.0–5.0)
Cholesterol, Total: 263 mg/dL — ABNORMAL HIGH (ref 100–199)
HDL: 39 mg/dL — ABNORMAL LOW (ref >39)
LDL Chol Calc (NIH): 159 mg/dL — ABNORMAL HIGH (ref 0–99)
Triglycerides: 344 mg/dL — ABNORMAL HIGH (ref 0–149)
VLDL Cholesterol Cal: 65 mg/dL — ABNORMAL HIGH (ref 5–40)

## 2023-02-24 ENCOUNTER — Ambulatory Visit (INDEPENDENT_AMBULATORY_CARE_PROVIDER_SITE_OTHER): Payer: Managed Care, Other (non HMO) | Admitting: Sports Medicine

## 2023-02-24 ENCOUNTER — Encounter: Payer: Self-pay | Admitting: Sports Medicine

## 2023-02-24 VITALS — BP 154/81 | HR 76 | Ht 71.0 in | Wt 216.0 lb

## 2023-02-24 DIAGNOSIS — R7401 Elevation of levels of liver transaminase levels: Secondary | ICD-10-CM

## 2023-02-24 DIAGNOSIS — E781 Pure hyperglyceridemia: Secondary | ICD-10-CM

## 2023-02-24 MED ORDER — ROSUVASTATIN CALCIUM 10 MG PO TABS: 10.0000 mg | ORAL_TABLET | Freq: Every day | ORAL | 3 refills | Status: DC

## 2023-02-24 NOTE — Progress Notes (Signed)
    Procedures performed today:    None.  Independent interpretation of notes and tests performed by another provider:   None.  Brief History, Exam, Impression, and Recommendations:    Hypertriglyceridemia Severely elevated lipids, predominantly hypertriglyceridemia but also elevated LDL and total cholesterol. I think we will get better cardiovascular disease prevention with a statin, we will start rosuvastatin 10 mg nightly, recheck in 2 months fasting.  Transaminitis secondary to alcohol intake Recurrent transaminitis, he did have several beers prior, we will recheck his LFTs, and he will not drink anything for 1 week prior to getting his liver function checked. Historically alcohol consumption has been excessive, back in 2021 it was 9 beers per night 4 nights per week equaling about 36 beers per week. I am happy to help him cut back his alcohol consumption in the future if he desires.    ____________________________________________ Ihor [place]. Benjamin Stain, M.D., ABFM., CAQSM., AME. Primary Care and Sports Medicine [place] [facility]  Adjunct Professor of Family Medicine  [employer]  Restaurant manager, fast food

## 2023-02-24 NOTE — Assessment & Plan Note (Signed)
Recurrent transaminitis, he did have several beers prior, we will recheck his LFTs, and he will not drink anything for 1 week prior to getting his liver function checked. Historically alcohol consumption has been excessive, back in 2021 it was 9 beers per night 4 nights per week equaling about 36 beers per week. I am happy to help him cut back his alcohol consumption in the future if he desires.

## 2023-02-24 NOTE — Assessment & Plan Note (Signed)
Severely elevated lipids, predominantly hypertriglyceridemia but also elevated LDL and total cholesterol. I think we will get better cardiovascular disease prevention with a statin, we will start rosuvastatin 10 mg nightly, recheck in 2 months fasting.

## 2023-11-17 ENCOUNTER — Encounter: Payer: Self-pay | Admitting: Sports Medicine

## 2023-11-17 ENCOUNTER — Ambulatory Visit

## 2023-11-17 ENCOUNTER — Ambulatory Visit (INDEPENDENT_AMBULATORY_CARE_PROVIDER_SITE_OTHER): Admitting: Sports Medicine

## 2023-11-17 VITALS — BP 107/67 | HR 80 | Resp 20 | Ht 71.0 in | Wt 211.0 lb

## 2023-11-17 DIAGNOSIS — E781 Pure hyperglyceridemia: Secondary | ICD-10-CM | POA: Diagnosis not present

## 2023-11-17 DIAGNOSIS — E559 Vitamin D deficiency, unspecified: Secondary | ICD-10-CM | POA: Diagnosis not present

## 2023-11-17 DIAGNOSIS — R0789 Other chest pain: Secondary | ICD-10-CM

## 2023-11-17 DIAGNOSIS — Z0000 Encounter for general adult medical examination without abnormal findings: Secondary | ICD-10-CM

## 2023-11-17 DIAGNOSIS — E538 Deficiency of other specified B group vitamins: Secondary | ICD-10-CM | POA: Diagnosis not present

## 2023-11-17 DIAGNOSIS — R7401 Elevation of levels of liver transaminase levels: Secondary | ICD-10-CM

## 2023-11-17 NOTE — Assessment & Plan Note (Signed)
 We did fill out biometric form today, the last official annual physical was 08/17/2022.

## 2023-11-17 NOTE — Assessment & Plan Note (Signed)
 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 The chest pain is not exertional. We suspected acid reflux, H. pylori testing was negative. We added pantoprazole , he did not take it. We also suggested minimizing alcohol intake, he has cut it in half from 4-5 drinks per day to about 2 drinks per day. I did suggest he touch base with gastroenterology, this did not happen. We will place the referral again, I would like updated x-rays. I do think he should take the pantoprazole  although he is preferring a nonpharmacologic approach. He also has extremely high cholesterol, I have warned him about the risks, he would like to avoid pharmacologic approach although I have suggested we start rosuvastatin . I would like him to consult with cardiology, we will do an ECG today, we will do some labs, I would also like him to have a stress test. Return to see me to go over all of the results.

## 2023-11-17 NOTE — Assessment & Plan Note (Signed)
 Severely elevated lipids, predominantly hypertriglyceridemia but also elevated LDL and total cholesterol, we have suggested rosuvastatin  10 mg. He has not started this, he does prefer to avoid medications though I have suggested that he do the rosuvastatin  for cardiovascular disease prevention. I emphasized to him that he was at a much higher risk considering his lipids. If still elevated he will again consider taking the rosuvastatin .

## 2023-11-17 NOTE — Progress Notes (Signed)
    Procedures performed today:    Twelve-lead ECG performed and interpreted by me, sinus bradycardia with normal rhythm, normal axis, no PR, QRS or ST abnormalities.  Independent interpretation of notes and tests performed by another provider:   None.  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 The chest pain is not exertional. We suspected acid reflux, H. pylori testing was negative. We added pantoprazole , he did not take it. We also suggested minimizing alcohol intake, he has cut it in half from 4-5 drinks per day to about 2 drinks per day. I did suggest he touch base with gastroenterology, this did not happen. We will place the referral again, I would like updated x-rays. I do think he should take the pantoprazole  although he is preferring a nonpharmacologic approach. He also has extremely high cholesterol, I have warned him about the risks, he would like to avoid pharmacologic approach although I have suggested we start rosuvastatin . I would like him to consult with cardiology, we will do an ECG today, we will do some labs, I would also like him to have a stress test. Return to see me to go over all of the results.    Hypertriglyceridemia Severely elevated lipids, predominantly hypertriglyceridemia but also elevated LDL and total cholesterol, we have suggested rosuvastatin  10 mg. He has not started this, he does prefer to avoid medications though I have suggested that he do the rosuvastatin  for cardiovascular disease prevention. I emphasized to him that he was at a much higher risk considering his lipids. If still elevated he will again consider taking the rosuvastatin .  Transaminitis secondary to alcohol intake Persistent transaminitis, has cut alcohol consumption  down significantly, back in 2021 it was approximately 9 beers per night 4 nights per week equaling about 36 beers per week. It dropped down to about 4 bourbons per night, and now is down to about 2 bourbons per night. We will recheck his LFTs and I would like an ultrasound with elastography.  Annual physical exam We did fill out biometric form today, the last official annual physical was 08/17/2022.  I spent 40 minutes of total time managing this patient today, this includes chart review, face to face, and non-face to face time.  This was separate from the time spent performing the ECG.  ____________________________________________ Joselyn Nicely. Sandy Crumb, M.D., ABFM., CAQSM., AME. Primary Care and Sports Medicine [facility]  Adjunct Professor of [facility] Medicine  [employer]  Restaurant manager, fast food

## 2023-11-17 NOTE — Assessment & Plan Note (Signed)
 Persistent transaminitis, has cut alcohol consumption down significantly, back in 2021 it was approximately 9 beers per night 4 nights per week equaling about 36 beers per week. It dropped down to about 4 bourbons per night, and now is down to about 2 bourbons per night. We will recheck his LFTs and I would like an ultrasound with elastography.

## 2023-11-18 ENCOUNTER — Ambulatory Visit (INDEPENDENT_AMBULATORY_CARE_PROVIDER_SITE_OTHER)

## 2023-11-18 ENCOUNTER — Ambulatory Visit: Payer: Self-pay | Admitting: Sports Medicine

## 2023-11-18 DIAGNOSIS — R7401 Elevation of levels of liver transaminase levels: Secondary | ICD-10-CM | POA: Diagnosis not present

## 2023-11-19 LAB — COMPREHENSIVE METABOLIC PANEL WITH GFR
ALT: 74 IU/L — ABNORMAL HIGH (ref 0–44)
AST: 48 IU/L — ABNORMAL HIGH (ref 0–40)
Albumin: 4.6 g/dL (ref 4.1–5.1)
Alkaline Phosphatase: 78 IU/L (ref 44–121)
BUN/Creatinine Ratio: 10 (ref 9–20)
BUN: 11 mg/dL (ref 6–20)
Bilirubin Total: 1.5 mg/dL — ABNORMAL HIGH (ref 0.0–1.2)
CO2: 22 mmol/L (ref 20–29)
Calcium: 9.6 mg/dL (ref 8.7–10.2)
Chloride: 100 mmol/L (ref 96–106)
Creatinine, Ser: 1.09 mg/dL (ref 0.76–1.27)
Globulin, Total: 2.9 g/dL (ref 1.5–4.5)
Glucose: 95 mg/dL (ref 70–99)
Potassium: 4.5 mmol/L (ref 3.5–5.2)
Sodium: 138 mmol/L (ref 134–144)
Total Protein: 7.5 g/dL (ref 6.0–8.5)
eGFR: 91 mL/min/{1.73_m2} (ref >59)

## 2023-11-19 LAB — CBC
Hematocrit: 48.4 % (ref 37.5–51.0)
Hemoglobin: 16.2 g/dL (ref 13.0–17.7)
MCH: 32.3 pg (ref 26.6–33.0)
MCHC: 33.5 g/dL (ref 31.5–35.7)
MCV: 96 fL (ref 79–97)
Platelets: 265 10*3/uL (ref 150–450)
RBC: 5.02 x10E6/uL (ref 4.14–5.80)
RDW: 12.1 % (ref 11.6–15.4)
WBC: 6.9 10*3/uL (ref 3.4–10.8)

## 2023-11-19 LAB — LIPID PANEL
Chol/HDL Ratio: 7.3 ratio — ABNORMAL HIGH (ref 0.0–5.0)
Cholesterol, Total: 211 mg/dL — ABNORMAL HIGH (ref 100–199)
HDL: 29 mg/dL — ABNORMAL LOW (ref >39)
LDL Chol Calc (NIH): 142 mg/dL — ABNORMAL HIGH (ref 0–99)
Triglycerides: 218 mg/dL — ABNORMAL HIGH (ref 0–149)
VLDL Cholesterol Cal: 40 mg/dL (ref 5–40)

## 2023-11-19 LAB — HEMOGLOBIN A1C
Est. average glucose Bld gHb Est-mCnc: 103 mg/dL
Hgb A1c MFr Bld: 5.2 % (ref 4.8–5.6)

## 2023-11-19 LAB — TSH: TSH: 0.631 u[IU]/mL (ref 0.450–4.500)

## 2023-11-19 LAB — VITAMIN B12: Vitamin B-12: 747 pg/mL (ref 232–1245)

## 2023-11-19 LAB — VITAMIN D 25 HYDROXY (VIT D DEFICIENCY, FRACTURES): Vit D, 25-Hydroxy: 25 ng/mL — ABNORMAL LOW (ref 30.0–100.0)

## 2023-11-20 ENCOUNTER — Other Ambulatory Visit: Payer: Self-pay | Admitting: Sports Medicine

## 2023-11-20 MED ORDER — VITAMIN D (ERGOCALCIFEROL) 1.25 MG (50000 UNIT) PO CAPS: 50000.0000 [IU] | ORAL_CAPSULE | ORAL | 0 refills | Status: DC

## 2023-11-29 NOTE — Addendum Note (Signed)
 Addended by: BONNY JON DEL on: 11/29/2023 10:32 AM   Modules accepted: Orders

## 2023-12-22 ENCOUNTER — Encounter: Payer: Self-pay | Admitting: Gastroenterology

## 2024-01-22 ENCOUNTER — Other Ambulatory Visit: Payer: Self-pay | Admitting: Sports Medicine

## 2024-01-31 ENCOUNTER — Encounter: Payer: Self-pay | Admitting: Sports Medicine

## 2024-02-16 ENCOUNTER — Ambulatory Visit: Admitting: Gastroenterology

## 2024-02-16 ENCOUNTER — Encounter: Payer: Self-pay | Admitting: Gastroenterology

## 2024-02-16 VITALS — BP 112/66 | HR 74 | Ht 71.0 in | Wt 207.2 lb

## 2024-02-16 DIAGNOSIS — K219 Gastro-esophageal reflux disease without esophagitis: Secondary | ICD-10-CM

## 2024-02-16 DIAGNOSIS — K529 Noninfective gastroenteritis and colitis, unspecified: Secondary | ICD-10-CM | POA: Diagnosis not present

## 2024-02-16 DIAGNOSIS — R0789 Other chest pain: Secondary | ICD-10-CM

## 2024-02-16 MED ORDER — NA SULFATE-K SULFATE-MG SULF 17.5-3.13-1.6 GM/177ML PO SOLN: 1.0000 | Freq: Once | ORAL | 0 refills | Status: AC

## 2024-02-16 NOTE — Progress Notes (Signed)
 02/16/2024 Scott Herring [id number] Jul 16, 1990   HISTORY OF PRESENT ILLNESS: This is a 33 year old male who is new to our office.  He has been referred here by his PCP on this occasion for complaints of reflux and chest pain.  He tells me that he has been having issues with this chest pain for the past 1 to 2 years.  Pain is located in the center of his chest and somewhat goes to either side.  Pains come and go, not present every day, but when they do present they are constant for periods of time.  Pain does not radiate to his back or shoulders.  He does report a lot of acid reflux issues since he was a kid with belching, burping, etc.  Obviously burping seems to be worse with drinking carbonated beverages.  His PCP had wanted him to see cardiology to have a stress test, but he has not yet proceeded with that.  PCP also had given him pantoprazole , but they documented in their notes that he did not take the medication.  Has not tried anything else over-the-counter for his symptoms.  H. pylori breath test normal last year.  TSH study normal.  He also tells me that he has frequent diarrhea.  He says that he does have some normal stools, but often times has diarrhea.  He travels for work frequently and contributes that mostly to his inconsistent diet with travel for work.  No rectal bleeding.  No abdominal pains.  Of note, looks like he had some elevated LFTs.  We did not address this today.  He had an ultrasound with elastography that showed a median K PA at 3.6.  From his PCPs note he had previously been a very heavy drinker, up to 36 beers a week, now drinking bourbon, it was 4 glasses of bourbon a day and now 2 glasses of bourbon a day.    Lab Results  Component Value Date   ALT 74 (H) 11/18/2023   AST 48 (H) 11/18/2023   ALKPHOS 78 11/18/2023   BILITOT 1.5 (H) 11/18/2023      History reviewed. No pertinent past medical history. Past Surgical History:  Procedure Laterality Date   EYE  SURGERY     SHOULDER SURGERY     WISDOM TOOTH EXTRACTION      reports that he has quit smoking. His smoking use included cigarettes and cigars. He has quit using smokeless tobacco.  His smokeless tobacco use included chew. He reports current alcohol use. He reports that he does not use drugs. family history includes Colon cancer in his paternal grandfather; Multiple sclerosis in his mother; Stroke in his paternal aunt. No Known Allergies    Outpatient Encounter Medications as of 02/16/2024  Medication Sig   Multiple Vitamin (MULTIVITAMIN) tablet Take 1 tablet by mouth daily.   [DISCONTINUED] erythromycin  ophthalmic ointment Place a 1/2 inch ribbon of ointment into the right lower eyelid BID. (Patient not taking: Reported on 11/17/2023)   [DISCONTINUED] pantoprazole  (PROTONIX ) 40 MG tablet Take 1 tablet (40 mg total) by mouth daily. (Patient not taking: Reported on 11/17/2023)   [DISCONTINUED] rosuvastatin  (CRESTOR ) 10 MG tablet Take 1 tablet (10 mg total) by mouth daily. (Patient not taking: Reported on 11/17/2023)   [DISCONTINUED] Vitamin D , Ergocalciferol , (DRISDOL ) 1.25 MG (50000 UNIT) CAPS capsule Take 1 capsule (50,000 Units total) by mouth every 7 (seven) days. Take for 8 total doses(weeks)   No facility-administered encounter medications on file as of 02/16/2024.  REVIEW OF SYSTEMS  : All other systems reviewed and negative except where noted in the History of Present Illness.   PHYSICAL EXAM: BP 112/66   Pulse 74   Ht 5' 11 (1.803 m)   Wt 207 lb 4 oz (94 kg)   SpO2 98%   BMI 28.91 kg/m  General: Well developed white male in no acute distress Head: Normocephalic and atraumatic Eyes:  Sclerae anicteric, conjunctiva pink. Ears: Normal auditory acuity Lungs: Clear throughout to auscultation; no W/R/R. Heart: Regular rate and rhythm; no M/R/G. Abdomen: Soft, non-distended.  BS present.  Non-tender. Rectal:  Will be done at the time of colonoscopy Musculoskeletal: Symmetrical  with no gross deformities  Skin: No lesions on visible extremities Extremities: No edema  Neurological: Alert oriented x 4, grossly non-focal Psychological:  Alert and cooperative. Normal mood and affect  ASSESSMENT AND PLAN: *Atypical chest pain/GERD: Has had longstanding reflux symptoms with burping, belching, heartburn/reflux.  His PCP did want him to see cardiology and have a stress test, which he has not proceeded with.  PCP had given him pantoprazole , but they documented that he did not take it. *Diarrhea: Has some normal stools, but has frequent diarrhea that he thinks is related to traveling for work and his inconsistent diet, etc.  **We will plan for both EGD and colonoscopy with Dr. Legrand, but we will schedule these out several weeks so that he has time to see cardiology and do any workup that they think is necessary.  We did call the cardiology office for him today and made an appointment.  The risks, benefits, and alternatives to EGD and colonoscopy were discussed with the patient and he consents to proceed.   CC:  Scott Herring,*

## 2024-02-16 NOTE — Patient Instructions (Addendum)
 Cardiology appointment scheduled for Wednesday 02/29/24 @ 2 pm. If you are unable to keep this appointment please call [phone].   You have been scheduled for an endoscopy and colonoscopy. Please follow the written instructions given to you at your visit today.  If you use inhalers (even only as needed), please bring them with you on the day of your procedure.  DO NOT TAKE 7 DAYS PRIOR TO TEST- Trulicity (dulaglutide) Ozempic, Wegovy (semaglutide) Mounjaro (tirzepatide) Bydureon Bcise (exanatide extended release)  DO NOT TAKE 1 DAY PRIOR TO YOUR TEST Rybelsus (semaglutide) Adlyxin (lixisenatide) Victoza (liraglutide) Byetta (exanatide) _________________________________________________________________________

## 2024-02-16 NOTE — Progress Notes (Signed)
 ____________________________________________________________  Attending physician addendum:  Thank you for sending this case to me. I have reviewed the entire note and agree with the plan.  I will biopsy him for sprue, H. pylori and microscopic colitis when he undergoes his procedures.  He would benefit from antireflux diet and lifestyle measures, most notably at least a temporary cessation of alcohol use.  Victory Brand, MD  ____________________________________________________________

## 2024-02-17 ENCOUNTER — Encounter: Payer: Self-pay | Admitting: Physician Assistant

## 2024-02-17 ENCOUNTER — Ambulatory Visit (INDEPENDENT_AMBULATORY_CARE_PROVIDER_SITE_OTHER): Admitting: Physician Assistant

## 2024-02-17 VITALS — BP 108/68 | HR 63 | Wt 208.0 lb

## 2024-02-17 DIAGNOSIS — K219 Gastro-esophageal reflux disease without esophagitis: Secondary | ICD-10-CM

## 2024-02-17 DIAGNOSIS — R0789 Other chest pain: Secondary | ICD-10-CM | POA: Diagnosis not present

## 2024-02-17 DIAGNOSIS — R7401 Elevation of levels of liver transaminase levels: Secondary | ICD-10-CM

## 2024-02-17 DIAGNOSIS — E781 Pure hyperglyceridemia: Secondary | ICD-10-CM

## 2024-02-17 DIAGNOSIS — E559 Vitamin D deficiency, unspecified: Secondary | ICD-10-CM | POA: Diagnosis not present

## 2024-02-17 DIAGNOSIS — K760 Fatty (change of) liver, not elsewhere classified: Secondary | ICD-10-CM | POA: Insufficient documentation

## 2024-02-17 DIAGNOSIS — E538 Deficiency of other specified B group vitamins: Secondary | ICD-10-CM | POA: Insufficient documentation

## 2024-02-17 NOTE — Patient Instructions (Signed)
 CT coronary calcium  testing ordered Labs ordered today Get stress test and then schedule endoscopy and colonoscopy.

## 2024-02-17 NOTE — Progress Notes (Signed)
 Established Patient Office Visit  Subjective   Patient ID: Scott Herring, male    DOB: September 14, 1990  Age: 33 y.o. MRN: [id number]     HPI Discussed the use of AI scribe software for clinical note transcription with the patient, who gave verbal consent to proceed.  History of Present Illness Scott Herring is a 33 year old male who presents with nonspecific chest tightness.  Chest tightness - Nonspecific chest tightness occurs randomly and is not associated with exertion - Elimination of caffeine from diet has noticeably improved symptoms - No associated shortness of breath or lower extremity swelling - No prior evaluation by a cardiologist - Stress test initially scheduled but rescheduled to November 12th due to conflict with GI procedure  Gastrointestinal evaluation - Scheduled for endoscopy and colonoscopy, rescheduled due to stress test timing - Not currently taking acid reducers - No medication recommendations from GI team at this time  Hyperlipidemia - History of elevated cholesterol - Significant lifestyle and dietary modifications implemented to address cholesterol  Physical activity - No regular exercise for the past six months due to busy schedule, moving, and work-related travel  Vitamin d  supplementation - Previously on weekly vitamin D  supplementation - Current use of vitamin D  supplement is unclear    ROS See HPI.    Objective:     BP 108/68 (Cuff Size: Normal)   Pulse 63   Wt 208 lb (94.3 kg)   SpO2 98%   BMI 29.01 kg/m  BP Readings from Last 3 Encounters:  02/17/24 108/68  02/16/24 112/66  11/17/23 107/67   Wt Readings from Last 3 Encounters:  02/17/24 208 lb (94.3 kg)  02/16/24 207 lb 4 oz (94 kg)  11/17/23 211 lb (95.7 kg)      Physical Exam Constitutional:      Appearance: Normal appearance.  HENT:     Head: Normocephalic.  Cardiovascular:     Rate and Rhythm: Normal rate and regular rhythm.  Pulmonary:     Effort: Pulmonary  effort is normal.  Neurological:     General: No focal deficit present.     Mental Status: He is alert and oriented to person, place, and time.  Psychiatric:        Mood and Affect: Mood normal.         Assessment & Plan:  SABRASABRADiagnoses and all orders for this visit:  Atypical chest pain -     CT CARDIAC SCORING (SELF PAY ONLY); Future  Hypertriglyceridemia -     Lipid panel -     CT CARDIAC SCORING (SELF PAY ONLY); Future  Vitamin D  deficiency -     VITAMIN D  25 Hydroxy (Vit-D Deficiency, Fractures)  Transaminitis -     CMP14+EGFR  Gastroesophageal reflux disease without esophagitis  Fatty liver -     CMP14+EGFR    Assessment and Plan Assessment & Plan Chest pain of unclear etiology Intermittent chest pain improved after eliminating caffeine. Differential includes non-cardiac causes. No family history of cardiovascular disease. Classic cardiac symptoms absent. - Proceed with stress test on November 12th. - Reschedule GI procedure after stress test. - Consider CT coronary calcium  score for coronary vascular risk assessment if avoiding statin therapy.  Hypercholesterolemia Lifestyle modifications implemented. Cholesterol levels to be re-evaluated for statin therapy decision. CT coronary calcium  score discussed for risk assessment. - Check fasting cholesterol levels today. - Discuss potential statin therapy based on cholesterol results. - Consider CT coronary calcium  score if avoiding statin therapy.  Pt does have  fatty liver with elevated liver enzymes -will recheck liver enzymes today -continue with diet and exercise changes -avoid alcohol  Vitamin D  deficiency Previously on weekly vitamin D  supplement. Current levels to be checked for supplementation need. - Check vitamin D  levels today. - Determine need for continued vitamin D  supplementation based on results.  Pt declined flu shot today.   Pt has moved to [facility] and looking for PCP.     Return if  symptoms worsen or fail to improve.    Johnye Kist, PA-C

## 2024-02-18 LAB — CMP14+EGFR
ALT: 39 IU/L (ref 0–44)
AST: 26 IU/L (ref 0–40)
Albumin: 4.7 g/dL (ref 4.1–5.1)
Alkaline Phosphatase: 76 IU/L (ref 47–123)
BUN/Creatinine Ratio: 11 (ref 9–20)
BUN: 11 mg/dL (ref 6–20)
Bilirubin Total: 2 mg/dL — ABNORMAL HIGH (ref 0.0–1.2)
CO2: 22 mmol/L (ref 20–29)
Calcium: 9.8 mg/dL (ref 8.7–10.2)
Chloride: 98 mmol/L (ref 96–106)
Creatinine, Ser: 0.98 mg/dL (ref 0.76–1.27)
Globulin, Total: 2.7 g/dL (ref 1.5–4.5)
Glucose: 86 mg/dL (ref 70–99)
Potassium: 4.5 mmol/L (ref 3.5–5.2)
Sodium: 136 mmol/L (ref 134–144)
Total Protein: 7.4 g/dL (ref 6.0–8.5)
eGFR: 103 mL/min/1.73 (ref >59)

## 2024-02-18 LAB — LIPID PANEL
Chol/HDL Ratio: 6 ratio — ABNORMAL HIGH (ref 0.0–5.0)
Cholesterol, Total: 179 mg/dL (ref 100–199)
HDL: 30 mg/dL — ABNORMAL LOW (ref >39)
LDL Chol Calc (NIH): 111 mg/dL — ABNORMAL HIGH (ref 0–99)
Triglycerides: 216 mg/dL — ABNORMAL HIGH (ref 0–149)
VLDL Cholesterol Cal: 38 mg/dL (ref 5–40)

## 2024-02-18 LAB — VITAMIN D 25 HYDROXY (VIT D DEFICIENCY, FRACTURES): Vit D, 25-Hydroxy: 29.2 ng/mL — ABNORMAL LOW (ref 30.0–100.0)

## 2024-02-20 ENCOUNTER — Ambulatory Visit: Payer: Self-pay | Admitting: Physician Assistant

## 2024-02-20 NOTE — Progress Notes (Signed)
 Scott Herring,   Kidney, liver, glucose looks good.  Vitamin D  still not to goal, take at least 2000 units a day with dairy for better absorption.  Your cholesterol does look better than last check.  Your LDL 111 your TG still elevated at 216. Consider starting fish oil OTC and continue working on diet changes. Get CT coronary calcium  screening.

## 2024-02-29 ENCOUNTER — Ambulatory Visit: Admitting: Cardiology

## 2024-03-05 ENCOUNTER — Ambulatory Visit (INDEPENDENT_AMBULATORY_CARE_PROVIDER_SITE_OTHER): Payer: Self-pay

## 2024-03-05 DIAGNOSIS — E781 Pure hyperglyceridemia: Secondary | ICD-10-CM

## 2024-03-05 DIAGNOSIS — R0789 Other chest pain: Secondary | ICD-10-CM

## 2024-03-05 HISTORY — PX: OTHER SURGICAL HISTORY: SHX169

## 2024-03-05 NOTE — Progress Notes (Signed)
 Coronary Calcium  Score is 0. GREAT news. No increased cardiovascular risk!

## 2024-04-06 ENCOUNTER — Telehealth: Payer: Self-pay | Admitting: Gastroenterology

## 2024-04-06 NOTE — Telephone Encounter (Signed)
 Good morning Dr. Legrand,   I received a call from this patient stating that he overbooked himself and has to reschedule his procedure that was scheduled for November the 10 th. Patient reschedule for December the 22 nd. Please advise.   Thank you.

## 2024-04-06 NOTE — Telephone Encounter (Signed)
 Understood, thanks.   - H. Danis

## 2024-04-09 ENCOUNTER — Encounter: Admitting: Gastroenterology

## 2024-04-10 ENCOUNTER — Telehealth: Payer: Self-pay

## 2024-04-10 NOTE — Telephone Encounter (Signed)
 Pt advised.

## 2024-04-10 NOTE — Telephone Encounter (Signed)
Yes okay with me.

## 2024-04-10 NOTE — Telephone Encounter (Signed)
 FYI pt is scheduled with you in Jan.  Copied from CRM #[id number]. Topic: Appointments - Transfer of Care >> Apr 10, 2024 10:11 AM Shereese L wrote: Pt is requesting to transfer FROM: Thekkekandam Pt is requesting to transfer TO: McGowen Reason for requested transfer: Doctor left It is the responsibility of the team the patient would like to transfer to (Dr. McGowen) to reach out to the patient if for any reason this transfer is not acceptable.

## 2024-04-11 ENCOUNTER — Ambulatory Visit: Admitting: Cardiology

## 2024-05-01 ENCOUNTER — Encounter: Payer: Self-pay | Admitting: *Deleted

## 2024-05-02 ENCOUNTER — Ambulatory Visit: Attending: Internal Medicine | Admitting: Internal Medicine

## 2024-05-02 ENCOUNTER — Encounter: Payer: Self-pay | Admitting: Internal Medicine

## 2024-05-02 VITALS — BP 103/50 | HR 60 | Ht 71.0 in | Wt 212.4 lb

## 2024-05-02 DIAGNOSIS — F1090 Alcohol use, unspecified, uncomplicated: Secondary | ICD-10-CM

## 2024-05-02 DIAGNOSIS — K760 Fatty (change of) liver, not elsewhere classified: Secondary | ICD-10-CM | POA: Diagnosis not present

## 2024-05-02 DIAGNOSIS — Z720 Tobacco use: Secondary | ICD-10-CM

## 2024-05-02 DIAGNOSIS — R0789 Other chest pain: Secondary | ICD-10-CM

## 2024-05-02 DIAGNOSIS — K219 Gastro-esophageal reflux disease without esophagitis: Secondary | ICD-10-CM | POA: Diagnosis not present

## 2024-05-02 DIAGNOSIS — E782 Mixed hyperlipidemia: Secondary | ICD-10-CM

## 2024-05-02 DIAGNOSIS — Z716 Tobacco abuse counseling: Secondary | ICD-10-CM

## 2024-05-02 NOTE — Patient Instructions (Signed)
 Medication Instructions:  No medication changes were made at this visit. Continue current regimen.  *If you need a refill on your cardiac medications before your next appointment, please call your pharmacy*  Testing/Procedures: Your physician has requested that you have an echocardiogram. Echocardiography is a painless test that uses sound waves to create images of your heart. It provides your doctor with information about the size and shape of your heart and how well your heart's chambers and valves are working. This procedure takes approximately one hour. There are no restrictions for this procedure. Please do NOT wear cologne, perfume, aftershave, or lotions (deodorant is allowed). Please arrive 15 minutes prior to your appointment time.  Please note: We ask at that you not bring children with you during ultrasound (echo/ vascular) testing. Due to room size and safety concerns, children are not allowed in the ultrasound rooms during exams. Our front office staff cannot provide observation of children in our lobby area while testing is being conducted. An adult accompanying a patient to their appointment will only be allowed in the ultrasound room at the discretion of the ultrasound technician under special circumstances. We apologize for any inconvenience.   Follow-Up: At [facility], you and your health needs are our priority.  As part of our continuing mission to provide you with exceptional heart care, our providers are all part of one team.  This team includes your primary Cardiologist (physician) and Advanced Practice Providers or APPs (Physician Assistants and Nurse Practitioners) who all work together to provide you with the care you need, when you need it.  Your next appointment:   As needed  Provider:   Emeline FORBES Calender, DO    We recommend signing up for the patient portal called MyChart.  Sign up information is provided on this After Visit Summary.  MyChart is used to  connect with patients for Virtual Visits (Telemedicine).  Patients are able to view lab/test results, encounter notes, upcoming appointments, etc.  Non-urgent messages can be sent to your provider as well.   To learn more about what you can do with MyChart, go to [email or web address].   *Stress test is already ordered, can we get it scheduled! Thank you!
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
## 2024-05-17 ENCOUNTER — Encounter: Admitting: Gastroenterology

## 2024-05-21 ENCOUNTER — Ambulatory Visit: Admitting: Gastroenterology

## 2024-05-21 ENCOUNTER — Encounter: Payer: Self-pay | Admitting: Gastroenterology

## 2024-05-21 VITALS — BP 113/71 | HR 51 | Temp 98.2°F | Ht 71.0 in | Wt 207.4 lb

## 2024-05-21 DIAGNOSIS — K219 Gastro-esophageal reflux disease without esophagitis: Secondary | ICD-10-CM

## 2024-05-21 DIAGNOSIS — K529 Noninfective gastroenteritis and colitis, unspecified: Secondary | ICD-10-CM

## 2024-05-21 MED ORDER — SODIUM CHLORIDE 0.9 % IV SOLN: 500.0000 mL | Freq: Once | INTRAVENOUS | Status: AC

## 2024-05-21 NOTE — Progress Notes (Signed)
 Pt's states no medical or surgical changes since previsit or office visit.

## 2024-05-21 NOTE — Progress Notes (Signed)
 Procedure canceled b/c cardiology workup not done. (See Cards note from 05/02/24)  Mutual decision after discussion with Oneil Riff, CRNA, then discussed with patient who was understandably upset.  I explained his safety is first priority.  Note sent to patient's cardiologist asking for notification when stress and echo are done (reportedly in January according to Mr Gordon). Also asked patient to send us  a portal message when his cardiology testing is done so we can reschedule his procedures if he is willing.   - Victory Brand, MD    [facility]

## 2024-05-21 NOTE — Progress Notes (Signed)
 Cards request Stress test and ECHO be completed before GI procedures. As of this AM stress test and ECHO have not been completed.  Patient cancelled for this mornings GI procedures.  Will await results before proceeding.

## 2024-06-18 ENCOUNTER — Telehealth (HOSPITAL_COMMUNITY): Payer: Self-pay | Admitting: *Deleted

## 2024-06-18 NOTE — Telephone Encounter (Signed)
 Patient given instructions for upcoming stress test.  Argentina Bees, RN

## 2024-06-20 ENCOUNTER — Encounter: Payer: Self-pay | Admitting: Family Medicine

## 2024-06-20 ENCOUNTER — Ambulatory Visit (INDEPENDENT_AMBULATORY_CARE_PROVIDER_SITE_OTHER): Admitting: Family Medicine

## 2024-06-20 ENCOUNTER — Ambulatory Visit (HOSPITAL_COMMUNITY)
Admission: RE | Admit: 2024-06-20 | Discharge: 2024-06-20 | Disposition: A | Source: Ambulatory Visit | Attending: Internal Medicine | Admitting: Internal Medicine

## 2024-06-20 ENCOUNTER — Ambulatory Visit (HOSPITAL_COMMUNITY)
Admission: RE | Admit: 2024-06-20 | Discharge: 2024-06-20 | Disposition: A | Discharge status: Home / Self Care | Source: Ambulatory Visit | Attending: Sports Medicine | Admitting: Sports Medicine

## 2024-06-20 VITALS — BP 106/64 | HR 55 | Temp 98.1°F | Ht 71.25 in | Wt 208.0 lb

## 2024-06-20 DIAGNOSIS — K760 Fatty (change of) liver, not elsewhere classified: Secondary | ICD-10-CM | POA: Diagnosis not present

## 2024-06-20 DIAGNOSIS — Z0000 Encounter for general adult medical examination without abnormal findings: Secondary | ICD-10-CM | POA: Diagnosis not present

## 2024-06-20 DIAGNOSIS — R0789 Other chest pain: Secondary | ICD-10-CM | POA: Insufficient documentation

## 2024-06-20 DIAGNOSIS — R7401 Elevation of levels of liver transaminase levels: Secondary | ICD-10-CM | POA: Diagnosis not present

## 2024-06-20 DIAGNOSIS — E7800 Pure hypercholesterolemia, unspecified: Secondary | ICD-10-CM

## 2024-06-20 LAB — EXERCISE TOLERANCE TEST
Angina Index: 0
Duke Treadmill Score: 13
Estimated workload: 15.3
Exercise duration (min): 13 min
Exercise duration (sec): 0 s
MPHR: 185 {beats}/min
Peak HR: 157 {beats}/min
Percent HR: 85 %
Rest HR: 59 {beats}/min
ST Depression (mm): 0 mm

## 2024-06-20 LAB — ECHOCARDIOGRAM COMPLETE
Area-P 1/2: 3.56 cm2
Height: 71.25 in
S' Lateral: 3.04 cm
Weight: 3328 [oz_av]

## 2024-06-20 NOTE — Patient Instructions (Signed)
 Health Maintenance, Male  Adopting a healthy lifestyle and getting preventive care are important in promoting health and wellness. Ask your health care provider about:  The right schedule for you to have regular tests and exams.  Things you can do on your own to prevent diseases and keep yourself healthy.  What should I know about diet, weight, and exercise?  Eat a healthy diet    Eat a diet that includes plenty of vegetables, fruits, low-fat dairy products, and lean protein.  Do not eat a lot of foods that are high in solid fats, added sugars, or sodium.  Maintain a healthy weight  Body mass index (BMI) is a measurement that can be used to identify possible weight problems. It estimates body fat based on height and weight. Your health care provider can help determine your BMI and help you achieve or maintain a healthy weight.  Get regular exercise  Get regular exercise. This is one of the most important things you can do for your health. Most adults should:  Exercise for at least 150 minutes each week. The exercise should increase your heart rate and make you sweat (moderate-intensity exercise).  Do strengthening exercises at least twice a week. This is in addition to the moderate-intensity exercise.  Spend less time sitting. Even light physical activity can be beneficial.  Watch cholesterol and blood lipids  Have your blood tested for lipids and cholesterol at 36 years of age, then have this test every 5 years.  You may need to have your cholesterol levels checked more often if:  Your lipid or cholesterol levels are high.  You are older than 35 years of age.  You are at high risk for heart disease.  What should I know about cancer screening?  Many types of cancers can be detected early and may often be prevented. Depending on your health history and family history, you may need to have cancer screening at various ages. This may include screening for:  Colorectal cancer.  Prostate cancer.  Skin cancer.  Lung  cancer.  What should I know about heart disease, diabetes, and high blood pressure?  Blood pressure and heart disease  High blood pressure causes heart disease and increases the risk of stroke. This is more likely to develop in people who have high blood pressure readings or are overweight.  Talk with your health care provider about your target blood pressure readings.  Have your blood pressure checked:  Every 3-5 years if you are 24-52 years of age.  Every year if you are 3 years old or older.  If you are between the ages of 60 and 72 and are a current or former smoker, ask your health care provider if you should have a one-time screening for abdominal aortic aneurysm (AAA).  Diabetes  Have regular diabetes screenings. This checks your fasting blood sugar level. Have the screening done:  Once every three years after age 66 if you are at a normal weight and have a low risk for diabetes.  More often and at a younger age if you are overweight or have a high risk for diabetes.  What should I know about preventing infection?  Hepatitis B  If you have a higher risk for hepatitis B, you should be screened for this virus. Talk with your health care provider to find out if you are at risk for hepatitis B infection.  Hepatitis C  Blood testing is recommended for:  Everyone born from [birth year] through 1967.  Anyone  with known risk factors for hepatitis C.  Sexually transmitted infections (STIs)  You should be screened each year for STIs, including gonorrhea and chlamydia, if:  You are sexually active and are younger than 36 years of age.  You are older than 36 years of age and your health care provider tells you that you are at risk for this type of infection.  Your sexual activity has changed since you were last screened, and you are at increased risk for chlamydia or gonorrhea. Ask your health care provider if you are at risk.  Ask your health care provider about whether you are at high risk for HIV. Your health care provider  may recommend a prescription medicine to help prevent HIV infection. If you choose to take medicine to prevent HIV, you should first get tested for HIV. You should then be tested every 3 months for as long as you are taking the medicine.  Follow these instructions at home:  Alcohol use  Do not drink alcohol if your health care provider tells you not to drink.  If you drink alcohol:  Limit how much you have to 0-2 drinks a day.  Know how much alcohol is in your drink. In the [place] one drink equals one 12 oz bottle of beer (355 mL), one 5 oz glass of wine (148 mL), or one 1 oz glass of hard liquor (44 mL).  Lifestyle  Do not use any products that contain nicotine or tobacco. These products include cigarettes, chewing tobacco, and vaping devices, such as e-cigarettes. If you need help quitting, ask your health care provider.  Do not use street drugs.  Do not share needles.  Ask your health care provider for help if you need support or information about quitting drugs.  General instructions  Schedule regular health, dental, and eye exams.  Stay current with your vaccines.  Tell your health care provider if:  You often feel depressed.  You have ever been abused or do not feel safe at home.  Summary  Adopting a healthy lifestyle and getting preventive care are important in promoting health and wellness.  Follow your health care provider's instructions about healthy diet, exercising, and getting tested or screened for diseases.  Follow your health care provider's instructions on monitoring your cholesterol and blood pressure.  This information is not intended to replace advice given to you by your health care provider. Make sure you discuss any questions you have with your health care provider.  Document Revised: 10/06/2020 Document Reviewed: 10/06/2020  Elsevier Patient Education  2024 [employer].
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
## 2024-06-20 NOTE — Progress Notes (Signed)
 Patient underwent exercise stress testing today and echocardiogram. He had excellent exercise tolerance and was able to achieve 15 METS. His echocardiogram was not finalized yet but there are no concerning findings on my personal review of the images. Given these findings the patient is considered low risk of perioperative MACE for the anticipated low risk GI procedure.  No further preop cardiac workup needed.   Thank you, Emeline FORBES Calender, DO

## 2024-06-21 ENCOUNTER — Ambulatory Visit: Payer: Self-pay | Admitting: Internal Medicine

## 2024-06-22 ENCOUNTER — Telehealth: Payer: Self-pay | Admitting: Gastroenterology

## 2024-06-22 NOTE — Telephone Encounter (Signed)
 Scott Herring,  This patient was scheduled for an EGD and colonoscopy with me in the [facility] on 05/21/2024 for chest pain and diarrhea. Procedures were canceled because he had not yet completed the cardiology workup.  I received a message from his cardiologist that the patient had a normal exercise stress test and echocardiogram.  Therefore, he can be directly rebooked for his procedures with me in the [facility]. Please contact him to make those arrangements.  I expect he would likely be able to manage the prep another instructions if they are sent to him by MyChart if he is agreeable.  This would save time and an [facility] nurse previsit slot. (I will send you a separate message so we can look at the schedule to find the next available double slot)  Thank you  H Danis

## 2024-06-28 ENCOUNTER — Other Ambulatory Visit: Payer: Self-pay

## 2024-06-28 DIAGNOSIS — K529 Noninfective gastroenteritis and colitis, unspecified: Secondary | ICD-10-CM

## 2024-06-28 DIAGNOSIS — R0789 Other chest pain: Secondary | ICD-10-CM

## 2024-06-28 MED ORDER — NA SULFATE-K SULFATE-MG SULF 17.5-3.13-1.6 GM/177ML PO SOLN: 1.0000 | Freq: Once | ORAL | 0 refills | Status: AC

## 2024-06-28 NOTE — Telephone Encounter (Signed)
 ECL scheduled 08/13/24 @ 1:30 pm. Instructions sent to the portal and prep sent to preferred pharmacy. Pt informed and states understanding.

## 2024-08-13 ENCOUNTER — Encounter: Admitting: Gastroenterology

## 2025-03-01 ENCOUNTER — Encounter: Admitting: Family Medicine
